# Patient Record
Sex: Female | Born: 1985
Health system: Southern US, Community
[De-identification: ages and names within clinical notes are randomized; demographics above are authoritative.]

## PROBLEM LIST (undated history)

## (undated) DIAGNOSIS — H409 Unspecified glaucoma: Secondary | ICD-10-CM

## (undated) DIAGNOSIS — I10 Essential (primary) hypertension: Secondary | ICD-10-CM

## (undated) DIAGNOSIS — E282 Polycystic ovarian syndrome: Secondary | ICD-10-CM

## (undated) DIAGNOSIS — Z975 Presence of (intrauterine) contraceptive device: Secondary | ICD-10-CM

## (undated) DIAGNOSIS — Z832 Family history of diseases of the blood and blood-forming organs and certain disorders involving the immune mechanism: Secondary | ICD-10-CM

## (undated) DIAGNOSIS — G4700 Insomnia, unspecified: Secondary | ICD-10-CM

## (undated) DIAGNOSIS — E669 Obesity, unspecified: Secondary | ICD-10-CM

## (undated) DIAGNOSIS — K649 Unspecified hemorrhoids: Secondary | ICD-10-CM

## (undated) DIAGNOSIS — N390 Urinary tract infection, site not specified: Secondary | ICD-10-CM

## (undated) DIAGNOSIS — Z973 Presence of spectacles and contact lenses: Secondary | ICD-10-CM

## (undated) HISTORY — DX: Urinary tract infection, site not specified: N39.0

## (undated) HISTORY — DX: Polycystic ovarian syndrome: E28.2

## (undated) HISTORY — DX: Obesity, unspecified: E66.9

## (undated) HISTORY — DX: Family history of diseases of the blood and blood-forming organs and certain disorders involving the immune mechanism: Z83.2

## (undated) HISTORY — DX: Insomnia, unspecified: G47.00

## (undated) HISTORY — DX: Presence of (intrauterine) contraceptive device: Z97.5

## (undated) HISTORY — DX: Unspecified hemorrhoids: K64.9

## (undated) HISTORY — DX: Presence of spectacles and contact lenses: Z97.3

## (undated) HISTORY — DX: Unspecified glaucoma: H40.9

## (undated) HISTORY — PX: WISDOM TOOTH EXTRACTION: SHX21

---
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
## 2003-02-21 ENCOUNTER — Emergency Department (HOSPITAL_COMMUNITY): Admission: EM | Admit: 2003-02-21 | Discharge: 2003-02-22 | Payer: Self-pay | Admitting: *Deleted

## 2007-01-14 DIAGNOSIS — E282 Polycystic ovarian syndrome: Secondary | ICD-10-CM

## 2007-01-14 HISTORY — PX: COLONOSCOPY: SHX174

## 2007-01-14 HISTORY — DX: Polycystic ovarian syndrome: E28.2

## 2008-08-01 ENCOUNTER — Encounter (INDEPENDENT_AMBULATORY_CARE_PROVIDER_SITE_OTHER): Payer: Self-pay | Admitting: Obstetrics and Gynecology

## 2008-08-01 ENCOUNTER — Inpatient Hospital Stay (HOSPITAL_COMMUNITY): Admission: AD | Admit: 2008-08-01 | Discharge: 2008-08-03 | Payer: Self-pay | Admitting: Obstetrics and Gynecology

## 2010-01-13 DIAGNOSIS — H409 Unspecified glaucoma: Secondary | ICD-10-CM

## 2010-01-13 HISTORY — DX: Unspecified glaucoma: H40.9

## 2010-04-21 LAB — CBC
HCT: 34.6 % — ABNORMAL LOW (ref 36.0–46.0)
HCT: 36.9 % (ref 36.0–46.0)
Hemoglobin: 11 g/dL — ABNORMAL LOW (ref 12.0–15.0)
Hemoglobin: 12 g/dL (ref 12.0–15.0)
MCHC: 31.9 g/dL (ref 30.0–36.0)
MCHC: 32.6 g/dL (ref 30.0–36.0)
MCV: 70.9 fL — ABNORMAL LOW (ref 78.0–100.0)
MCV: 72.9 fL — ABNORMAL LOW (ref 78.0–100.0)
Platelets: 232 10*3/uL (ref 150–400)
Platelets: 238 10*3/uL (ref 150–400)
RBC: 4.74 MIL/uL (ref 3.87–5.11)
RBC: 5.2 MIL/uL — ABNORMAL HIGH (ref 3.87–5.11)
RDW: 14.8 % (ref 11.5–15.5)
RDW: 15 % (ref 11.5–15.5)
WBC: 10.7 10*3/uL — ABNORMAL HIGH (ref 4.0–10.5)
WBC: 9.7 10*3/uL (ref 4.0–10.5)

## 2010-04-21 LAB — TYPE AND SCREEN
ABO/RH(D): B POS
Antibody Screen: POSITIVE
DAT, IgG: NEGATIVE
PT AG Type: NEGATIVE

## 2010-04-21 LAB — RPR: RPR Ser Ql: NONREACTIVE

## 2010-05-28 NOTE — Discharge Summary (Signed)
[name], [name]                ACCOUNT NO.:  [id number]   MEDICAL RECORD NO.:  [id number]          PATIENT TYPE:  INP   LOCATION:  9115                          FACILITY:  [facility]   PHYSICIAN:  Malachi Pro. Ambrose Mantle, M.D. DATE OF BIRTH:  18-Oct-1985   DATE OF ADMISSION:  08/01/2008  DATE OF DISCHARGE:  08/03/2008                               DISCHARGE SUMMARY   This is a 25 year old African American single female para 0, gravida 1,  EDC August 30, 2008, admitted with ruptured membranes and labor.  Blood  group and type was B+ with a positive antibody, anti-M.  Sickle cell was  positive.  Hemoglobin electrophoresis; 68.7% hemoglobin A, 27.7%  hemoglobin S, and 3.6% hemoglobin A2.  RPR nonreactive, rubella immune,  hepatitis B surface antigen negative, HIV negative, GC and chlamydia  negative, 1-hour Glucola 103, first trimester screen negative, AFP  negative, group B strep positive.  On January 24, 2008, the patient had  a vaginal ultrasound, crown-rump length 2.07 cm, 8 weeks 5 days, Anita [name]  August 30, 2008.  Followup ultrasound compatible with dates.  Prenatal  care was uncomplicated on July 31, 2008, when she began contracting and  then had spontaneous rupture of membranes.  She came to the Maternity  Admission Unit and her cervix was 4-5 cm dilated.   PAST MEDICAL HISTORY:   ALLERGIES:  No known allergies.   OPERATIONS:  None.   ILLNESSES:  Urinary tract infection.   SOCIAL HISTORY:  Alcohol, tobacco and drugs none.   FAMILY HISTORY:  Mother with high blood pressure and thyroid problem.  Father with DVTs and sickle cell trait.  Maternal grandmother CVA and  high blood pressure.  On admission, the patient's vital signs were  normal.  Heart and lungs were normal.  The abdomen is soft.  Fundal  height have been 35-1/2 cm on July 28, 2008, fetal heart tones were  reactive.  There was some mild variable decelerations.  Cervix was 6 cm  dilated by the delivery room nurse and 90% effaced,  vertex at a -2 at  3:20 a.m.  The patient was begun on IV penicillin because of her anti-M  antibody.  She was typed and screened.  She became fully dilated.  At  6:00 a.m., she was pushing well.  Contractions every 4 minutes of  Pitocin was begun.  She pushed well and delivered spontaneously LOA over  an intact perineum with a periclitoral laceration, Dr. Ambrose Mantle, a 5-pound  14-ounce female infant with Apgars of 9 at 1 and 9 at 5 minutes.  The  placenta was intact.  Uterus normal.  Clitoral laceration sutured with 3-  0 Vicryl.  Blood loss about 400 mL.  Postpartum, the patient and the  baby did well and was discharged on the second postpartum day.  The baby  was circumcised and was in the regular nursery.  RPR was nonreactive.  Blood group and type was B+ with a positive antibody, anti-M.  The  patient's initial hemoglobin was on 12.0, hematocrit 36.9, white count  97,100, platelet count 238,000.  Followup hemoglobin 11.0, hematocrit  34.6, white count 10,700, platelet count 232,000.   FINAL DIAGNOSES:  Intrauterine pregnancy at 35 weeks and 6 days,  delivered left occipitoanterior.   OPERATIONS:  Spontaneous delivery LOA, repair of periclitoral  laceration.   FINAL CONDITION:  Improved.   INSTRUCTIONS:  Our regular discharge instruction booklet.  Motrin 600 mg  30 tablets 1 every 6 hours as needed for pain is given at discharge.  The patient is asked to come back to the office in 6 weeks for followup  examination.      Malachi Pro. Ambrose Mantle, M.D.  Electronically Signed     TFH/MEDQ  D:  08/03/2008  T:  08/03/2008  Job:  [id number]

## 2011-08-28 ENCOUNTER — Ambulatory Visit (INDEPENDENT_AMBULATORY_CARE_PROVIDER_SITE_OTHER): Payer: BC Managed Care – PPO | Admitting: Physician Assistant

## 2011-08-28 VITALS — BP 118/82 | HR 62 | Temp 98.5°F | Resp 16 | Ht 66.5 in | Wt 193.0 lb

## 2011-08-28 DIAGNOSIS — R350 Frequency of micturition: Secondary | ICD-10-CM

## 2011-08-28 DIAGNOSIS — N390 Urinary tract infection, site not specified: Secondary | ICD-10-CM

## 2011-08-28 DIAGNOSIS — H409 Unspecified glaucoma: Secondary | ICD-10-CM | POA: Insufficient documentation

## 2011-08-28 LAB — POCT UA - MICROSCOPIC ONLY

## 2011-08-28 LAB — POCT URINALYSIS DIPSTICK
Bilirubin, UA: NEGATIVE
Glucose, UA: NEGATIVE
Spec Grav, UA: >=1.03

## 2011-08-28 MED ORDER — PHENAZOPYRIDINE HCL 200 MG PO TABS: 200.0000 mg | ORAL_TABLET | Freq: Three times a day (TID) | ORAL | Status: AC | PRN

## 2011-08-28 MED ORDER — NITROFURANTOIN MACROCRYSTAL 100 MG PO CAPS: 100.0000 mg | ORAL_CAPSULE | Freq: Four times a day (QID) | ORAL | Status: AC

## 2011-08-28 NOTE — Progress Notes (Signed)
  Subjective:    Patient ID: Anita [name], female    DOB: 06-22-1985, 26 y.o.   MRN: [id number]  HPI  Pt presents to clinic with 2 wk h/o urinary frequency and dysuria.  She has had some back pain and bladder pressure.  She has no fever, chills or nausea or vomiting.  She has no vaginal symptoms and no new sexual contacts.  She is not concerned about pregnancy.  Review of Systems  Constitutional: Negative for fever and chills.  Gastrointestinal: Positive for abdominal pain (pressure).  Genitourinary: Positive for dysuria, urgency and frequency. Negative for hematuria and vaginal discharge.  Musculoskeletal: Positive for back pain.       Objective:   Physical Exam  Vitals reviewed. Constitutional: She is oriented to person, place, and time. She appears well-developed and well-nourished.  HENT:  Head: Normocephalic and atraumatic.  Right Ear: External ear normal.  Left Ear: External ear normal.  Eyes: Conjunctivae are normal.  Cardiovascular: Normal rate, regular rhythm and normal heart sounds.   Pulmonary/Chest: Effort normal and breath sounds normal.  Abdominal: Soft. Bowel sounds are normal. There is tenderness (suprapubic ttp). There is no CVA tenderness.  Neurological: She is alert and oriented to person, place, and time.  Skin: Skin is warm and dry.  Psychiatric: She has a normal mood and affect. Her behavior is normal. Judgment and thought content normal.    Results for orders placed in visit on 08/28/11  POCT URINALYSIS DIPSTICK      Component Value Range   Color, UA yellow     Clarity, UA cloudy     Glucose, UA neg     Bilirubin, UA neg     Ketones, UA trace     Spec Grav, UA >=1.030     Blood, UA trace     pH, UA 5.5     Protein, UA trace     Urobilinogen, UA 0.2     Nitrite, UA neg     Leukocytes, UA moderate (2+)    POCT UA - MICROSCOPIC ONLY      Component Value Range   WBC, Ur, HPF, POC TNTC     RBC, urine, microscopic 2-5     Bacteria, U Microscopic  small     Mucus, UA small     Epithelial cells, urine per micros 2-4     Crystals, Ur, HPF, POC neg     Casts, Ur, LPF, POC neg     Yeast, UA neg           Assessment & Plan:   1. Urinary frequency  POCT urinalysis dipstick, POCT UA - Microscopic Only  2. UTI (lower urinary tract infection)  nitrofurantoin (MACRODANTIN) 100 MG capsule, phenazopyridine (PYRIDIUM) 200 MG tablet, Urine culture   Pt to push fluids.  Take abx. Answered questions.

## 2011-08-31 LAB — URINE CULTURE

## 2012-02-17 ENCOUNTER — Telehealth: Payer: Self-pay | Admitting: Internal Medicine

## 2012-02-17 NOTE — Telephone Encounter (Signed)
LVOM with Shawn for pt to return call.

## 2012-03-09 ENCOUNTER — Telehealth: Payer: Self-pay | Admitting: Oncology

## 2012-03-09 NOTE — Telephone Encounter (Signed)
Pt called to scheduled her NP appt 03/28 @ 10:30 w/Dr. Clelia Croft.  Pt requested Friday appt.  Dx- Persistent Low WBC Welcome packet mailed.

## 2012-03-11 ENCOUNTER — Telehealth: Payer: Self-pay | Admitting: Oncology

## 2012-03-11 NOTE — Telephone Encounter (Signed)
C/D 03/11/12 for appt. 04/09/12 °

## 2012-04-02 ENCOUNTER — Other Ambulatory Visit: Payer: Self-pay | Admitting: Oncology

## 2012-04-02 DIAGNOSIS — D72819 Decreased white blood cell count, unspecified: Secondary | ICD-10-CM

## 2012-04-09 ENCOUNTER — Encounter: Payer: Self-pay | Admitting: Oncology

## 2012-04-09 ENCOUNTER — Telehealth: Payer: Self-pay | Admitting: Oncology

## 2012-04-09 ENCOUNTER — Ambulatory Visit: Payer: Self-pay

## 2012-04-09 ENCOUNTER — Ambulatory Visit (HOSPITAL_BASED_OUTPATIENT_CLINIC_OR_DEPARTMENT_OTHER): Payer: BC Managed Care – PPO | Admitting: Oncology

## 2012-04-09 ENCOUNTER — Other Ambulatory Visit (HOSPITAL_BASED_OUTPATIENT_CLINIC_OR_DEPARTMENT_OTHER): Payer: BC Managed Care – PPO | Admitting: Lab

## 2012-04-09 VITALS — BP 123/67 | HR 77 | Temp 98.3°F | Resp 20 | Wt 217.9 lb

## 2012-04-09 DIAGNOSIS — N920 Excessive and frequent menstruation with regular cycle: Secondary | ICD-10-CM

## 2012-04-09 DIAGNOSIS — D72819 Decreased white blood cell count, unspecified: Secondary | ICD-10-CM

## 2012-04-09 DIAGNOSIS — R718 Other abnormality of red blood cells: Secondary | ICD-10-CM

## 2012-04-09 LAB — CBC WITH DIFFERENTIAL/PLATELET
Eosinophils Absolute: 0.2 10*3/uL (ref 0.0–0.5)
HCT: 36.7 % (ref 34.8–46.6)
LYMPH%: 25.5 % (ref 14.0–49.7)
MONO#: 0.4 10*3/uL (ref 0.1–0.9)
NEUT#: 2.5 10*3/uL (ref 1.5–6.5)
NEUT%: 59.9 % (ref 38.4–76.8)
Platelets: 236 10*3/uL (ref 145–400)
RBC: 5.16 10*6/uL (ref 3.70–5.45)
WBC: 4.2 10*3/uL (ref 3.9–10.3)
lymph#: 1.1 10*3/uL (ref 0.9–3.3)

## 2012-04-09 LAB — COMPREHENSIVE METABOLIC PANEL (CC13)
ALT: 9 U/L (ref 0–55)
Albumin: 3.5 g/dL (ref 3.5–5.0)
CO2: 28 mEq/L (ref 22–29)
Calcium: 9.2 mg/dL (ref 8.4–10.4)
Chloride: 105 mEq/L (ref 98–107)
Glucose: 73 mg/dl (ref 70–99)
Sodium: 141 mEq/L (ref 136–145)
Total Bilirubin: 0.21 mg/dL (ref 0.20–1.20)
Total Protein: 7.4 g/dL (ref 6.4–8.3)

## 2012-04-09 LAB — CHCC SMEAR

## 2012-04-09 NOTE — Progress Notes (Signed)
Checked in new pt with no financial concerns. °

## 2012-04-09 NOTE — Progress Notes (Signed)
Note dictated

## 2012-04-12 NOTE — Progress Notes (Signed)
CC:   Anita [name]  REASON FOR CONSULTATION:  Leukocytopenia.  HISTORY OF PRESENT ILLNESS:  This is a pleasant 27 year old [place] American woman, currently of [place].  She is a relatively healthy woman, without really any significant co-morbid conditions.  She has had a history of heavy menstrual bleeding in the past and has re-established care with [facility] providers.  She has been followed previously by Dr. Jackelyn Knife.  She had a CBC that was done in December 2013, and at that time her white cell count was 3.8, her hemoglobin was 11.8, an MCV of 64, and she was diagnosed with iron-deficiency anemia due to menorrhagia and was started on multivitamin.  A repeat blood count that was done on 02/13/2012 showed her hemoglobin was back to normal at 12, and her MCV was improved dramatically at 71.  Her total white cell count, however, was low at 1.9.  At that time her differential showed 45% lymphocytes, that was really within normal range.  Her neutrophils were at 43.9%, which is slightly low.  For that reason, the patient was referred to me for evaluation for leukocytopenia and neutropenia. Clinically she feels well.  She is asymptomatic.  She does not report any recurrent sinopulmonary infection, had not had any viral illnesses, had not had any hospitalizations, had not had anything to suggest autoimmune disorders.  She had not had any arthralgias or myalgias.  She has continued to work full time.  She does report mild fatigue by the end of the day, but not out of the ordinary.  She did not report any back pain.  Did not report any pathological fractures.  Performance status and activity level continue to be unchanged at this time.  REVIEW OF SYSTEMS:  No bleeding, headaches, blurry vision, double vision.  Does not report any motor or sensory neuropathy.  Does not report alteration in mental status.  Does not report any psychiatric issues, depression.  Does  not report any fever, chills, sweats.  Does not report any cough, hemoptysis, hematemesis.  No nausea, vomiting.  No abdominal pain, hematochezia, melena.  No genitourinary complaints. Rest of the review of systems was unremarkable.  PAST MEDICAL HISTORY:  Significant for history of glaucoma, but she denied any history of hypertension, diabetes, coronary disease.  She does have a history of menorrhagia.  MEDICATIONS:  She is currently on Travatan eye drops.  She also uses a NuvaRing for 3 weeks as directed.  ALLERGIES:  None.  SOCIAL HISTORY:  She is single.  She denies any alcohol or tobacco abuse.  She has 1 child.  She has been pregnant once.  FAMILY HISTORY:  Her father has high blood pressure, had history of a DVT.  Mother has diabetes and hypertension.  No history of blood disorders.  PHYSICAL EXAMINATION:  General:  Alert, awake, pleasant woman, appeared in no active distress.  Vital Signs:  Her blood pressure today is at 123/67, pulse 77, respirations 20, afebrile.  HEENT:  Head is normocephalic, atraumatic.  Pupils equal, round, and reactive to light. Oral mucosa moist and pink.  Neck:  Supple, without adenopathy.  Heart: Regular rate and rhythm.  S1, S2.  Lungs:  Clear to auscultation.  No rhonchi, wheeze, or dullness to percussion.  Abdomen:  Soft, nontender. No hepatosplenomegaly.  Extremities:  No clubbing, cyanosis, or edema. Neurologic:  Intact motor, sensory, and deep tendon reflexes.  LABORATORY DATA:  Showed a hemoglobin of 11.9, which was normal.  White cell count is 4.2, which  is normal.  Platelet count of 236,000, which is normal.  Her differential white cells are perfectly proportional of neutrophils, lymphocytes, and monocytes.  Her peripheral smear was personally reviewed today and did not really show any evidence of any dysplasia, did not show evidence of any schistocytosis or red cell fragmentation.  There was no evidence of any immature white cells or  red cells.  Her platelets appeared normal.  ASSESSMENT AND PLAN:  This is a pleasant 27 year old woman with the following issues. 1. Transient leukopenia and neutropenia.  Differential diagnosis     discussed today with Anita [name].  It is unclear to me why she     developed that one episode of neutropenia.  Her total white cell     count was normal in December 2013.  It is perfectly normal now with     a peripheral differential.  It is very possible that it could be     medication related or infection related or could be a sampling     error.  Either way, it seems to have resolved itself at this point.     I doubt there is any evidence of anything to suggest     myelodysplastic syndrome or a lymphoproliferative disorder or a     myeloproliferative disorder at this time; but for completeness     sake, I will repeat her blood counts in about 6 months, and if her     white cell count shows to be normal, then no further workup is     needed.  I do not see any need for any scanning or bone marrow     biopsy.  Most likely it was a reactive finding and has resolved at     this time. 2. A microcytosis without anemia.  Undoubtedly related to menorrhagia.     Continue to encourage her to take multivitamin, possibly oral iron     supplement as needed.  All her questions were answered today.    ______________________________ Benjiman Core, M.D. FNS/MEDQ  D:  04/09/2012  T:  04/09/2012  Job:  [id number]

## 2012-06-11 ENCOUNTER — Other Ambulatory Visit: Payer: Self-pay | Admitting: Lab

## 2012-06-11 ENCOUNTER — Ambulatory Visit: Payer: Self-pay | Admitting: Oncology

## 2012-06-11 ENCOUNTER — Ambulatory Visit: Payer: Self-pay

## 2012-09-02 ENCOUNTER — Telehealth: Payer: Self-pay | Admitting: Oncology

## 2012-09-02 NOTE — Telephone Encounter (Signed)
Pt called and moved 9/30 appt from AM to PM.

## 2012-10-11 ENCOUNTER — Telehealth: Payer: Self-pay | Admitting: Oncology

## 2012-10-11 NOTE — Telephone Encounter (Signed)
PT CALLED TO R/S APPTS TO TUESDAY...FIRST AVAILABLE.

## 2012-10-12 ENCOUNTER — Ambulatory Visit: Payer: Self-pay | Admitting: Oncology

## 2012-10-12 ENCOUNTER — Other Ambulatory Visit: Payer: Self-pay | Admitting: Lab

## 2012-11-02 ENCOUNTER — Ambulatory Visit: Payer: Self-pay | Admitting: Oncology

## 2012-11-02 ENCOUNTER — Other Ambulatory Visit: Payer: Self-pay | Admitting: Lab

## 2013-01-13 DIAGNOSIS — Z975 Presence of (intrauterine) contraceptive device: Secondary | ICD-10-CM

## 2013-01-13 HISTORY — DX: Presence of (intrauterine) contraceptive device: Z97.5

## 2013-02-17 ENCOUNTER — Ambulatory Visit: Payer: BC Managed Care – PPO | Admitting: Gynecology

## 2013-10-21 ENCOUNTER — Emergency Department (HOSPITAL_COMMUNITY)
Admission: EM | Admit: 2013-10-21 | Discharge: 2013-10-21 | Disposition: A | Discharge status: Home / Self Care | Payer: BC Managed Care – PPO | Attending: Emergency Medicine | Admitting: Emergency Medicine

## 2013-10-21 ENCOUNTER — Encounter (HOSPITAL_COMMUNITY): Payer: Self-pay | Admitting: Emergency Medicine

## 2013-10-21 ENCOUNTER — Emergency Department (HOSPITAL_COMMUNITY): Payer: BC Managed Care – PPO

## 2013-10-21 DIAGNOSIS — R109 Unspecified abdominal pain: Secondary | ICD-10-CM | POA: Diagnosis present

## 2013-10-21 DIAGNOSIS — R112 Nausea with vomiting, unspecified: Secondary | ICD-10-CM | POA: Insufficient documentation

## 2013-10-21 DIAGNOSIS — Z3202 Encounter for pregnancy test, result negative: Secondary | ICD-10-CM | POA: Insufficient documentation

## 2013-10-21 DIAGNOSIS — N3090 Cystitis, unspecified without hematuria: Secondary | ICD-10-CM | POA: Insufficient documentation

## 2013-10-21 DIAGNOSIS — K5909 Other constipation: Secondary | ICD-10-CM

## 2013-10-21 DIAGNOSIS — K5900 Constipation, unspecified: Secondary | ICD-10-CM | POA: Diagnosis not present

## 2013-10-21 DIAGNOSIS — N390 Urinary tract infection, site not specified: Secondary | ICD-10-CM

## 2013-10-21 LAB — CBC WITH DIFFERENTIAL/PLATELET
BASOS PCT: 0 % (ref 0–1)
Basophils Absolute: 0 10*3/uL (ref 0.0–0.1)
EOS PCT: 0 % (ref 0–5)
Eosinophils Absolute: 0 10*3/uL (ref 0.0–0.7)
HEMATOCRIT: 34.8 % — AB (ref 36.0–46.0)
Hemoglobin: 11.5 g/dL — ABNORMAL LOW (ref 12.0–15.0)
LYMPHS ABS: 1.1 10*3/uL (ref 0.7–4.0)
Lymphocytes Relative: 14 % (ref 12–46)
MCH: 22.9 pg — AB (ref 26.0–34.0)
MCHC: 33 g/dL (ref 30.0–36.0)
MCV: 69.3 fL — AB (ref 78.0–100.0)
MONO ABS: 0.3 10*3/uL (ref 0.1–1.0)
Monocytes Relative: 4 % (ref 3–12)
NEUTROS ABS: 6.4 10*3/uL (ref 1.7–7.7)
Neutrophils Relative %: 82 % — ABNORMAL HIGH (ref 43–77)
PLATELETS: 221 10*3/uL (ref 150–400)
RBC: 5.02 MIL/uL (ref 3.87–5.11)
RDW: 13.9 % (ref 11.5–15.5)
WBC: 7.8 10*3/uL (ref 4.0–10.5)

## 2013-10-21 LAB — COMPREHENSIVE METABOLIC PANEL
ALT: 6 U/L (ref 0–35)
AST: 11 U/L (ref 0–37)
Albumin: 3.9 g/dL (ref 3.5–5.2)
Alkaline Phosphatase: 82 U/L (ref 39–117)
Anion gap: 16 — ABNORMAL HIGH (ref 5–15)
BILIRUBIN TOTAL: 0.2 mg/dL — AB (ref 0.3–1.2)
BUN: 12 mg/dL (ref 6–23)
CALCIUM: 9.2 mg/dL (ref 8.4–10.5)
CO2: 22 meq/L (ref 19–32)
CREATININE: 0.83 mg/dL (ref 0.50–1.10)
Chloride: 102 mEq/L (ref 96–112)
GLUCOSE: 135 mg/dL — AB (ref 70–99)
Potassium: 3.6 mEq/L — ABNORMAL LOW (ref 3.7–5.3)
Sodium: 140 mEq/L (ref 137–147)
Total Protein: 8 g/dL (ref 6.0–8.3)

## 2013-10-21 LAB — URINALYSIS, ROUTINE W REFLEX MICROSCOPIC
Bilirubin Urine: NEGATIVE
Glucose, UA: NEGATIVE mg/dL
KETONES UR: NEGATIVE mg/dL
Nitrite: NEGATIVE
PROTEIN: NEGATIVE mg/dL
Specific Gravity, Urine: 1.028 (ref 1.005–1.030)
UROBILINOGEN UA: 1 mg/dL (ref 0.0–1.0)
pH: 7 (ref 5.0–8.0)

## 2013-10-21 LAB — URINE MICROSCOPIC-ADD ON

## 2013-10-21 LAB — POC URINE PREG, ED: Preg Test, Ur: NEGATIVE

## 2013-10-21 LAB — LIPASE, BLOOD: LIPASE: 16 U/L (ref 11–59)

## 2013-10-21 MED ORDER — CEPHALEXIN 500 MG PO CAPS: 500.0000 mg | ORAL_CAPSULE | Freq: Two times a day (BID) | ORAL | Status: DC

## 2013-10-21 MED ORDER — ONDANSETRON 4 MG PO TBDP: 4.0000 mg | ORAL_TABLET | Freq: Three times a day (TID) | ORAL | Status: DC | PRN

## 2013-10-21 MED ORDER — SODIUM CHLORIDE 0.9 % IV BOLUS (SEPSIS)
1000.0000 mL | Freq: Once | INTRAVENOUS | Status: AC
  Administered 2013-10-21: 1000 mL via INTRAVENOUS

## 2013-10-21 MED ORDER — DICYCLOMINE HCL 10 MG/ML IM SOLN
20.0000 mg | Freq: Once | INTRAMUSCULAR | Status: AC
  Administered 2013-10-21: 20 mg via INTRAMUSCULAR
  Filled 2013-10-21: qty 2

## 2013-10-21 MED ORDER — KETOROLAC TROMETHAMINE 30 MG/ML IJ SOLN
30.0000 mg | Freq: Once | INTRAMUSCULAR | Status: AC
Start: 2013-10-21 — End: 2013-10-21
  Administered 2013-10-21: 30 mg via INTRAVENOUS
  Filled 2013-10-21: qty 1

## 2013-10-21 MED ORDER — ONDANSETRON HCL 4 MG/2ML IJ SOLN
4.0000 mg | Freq: Once | INTRAMUSCULAR | Status: AC
  Administered 2013-10-21: 4 mg via INTRAVENOUS
  Filled 2013-10-21: qty 2

## 2013-10-21 NOTE — ED Notes (Signed)
Pt presents with c/o abdominal pain that started several hours ago. Pt reports the pain is mainly in the lower left side of her abdomen. Reports she has not had a bowel movement for approx one week. Reports vomiting three times since the pain started, no diarrhea.

## 2013-10-21 NOTE — Discharge Instructions (Signed)
Constipation °Constipation is when a person has fewer than three bowel movements a week, has difficulty having a bowel movement, or has stools that are dry, hard, or larger than normal. As people grow older, constipation is more common. If you try to fix constipation with medicines that make you have a bowel movement (laxatives), the problem may get worse. Long-term laxative use may cause the muscles of the colon to become weak. A low-fiber diet, not taking in enough fluids, and taking certain medicines may make constipation worse.  °CAUSES  °· Certain medicines, such as antidepressants, pain medicine, iron supplements, antacids, and water pills.   °· Certain diseases, such as diabetes, irritable bowel syndrome (IBS), thyroid disease, or depression.   °· Not drinking enough water.   °· Not eating enough fiber-rich foods.   °· Stress or travel.   °· Lack of physical activity or exercise.   °· Ignoring the urge to have a bowel movement.   °· Using laxatives too much.   °SIGNS AND SYMPTOMS  °· Having fewer than three bowel movements a week.   °· Straining to have a bowel movement.   °· Having stools that are hard, dry, or larger than normal.   °· Feeling full or bloated.   °· Pain in the lower abdomen.   °· Not feeling relief after having a bowel movement.   °DIAGNOSIS  °Your health care provider will take a medical history and perform a physical exam. Further testing may be done for severe constipation. Some tests may include: °· A barium enema X-ray to examine your rectum, colon, and, sometimes, your small intestine.   °· A sigmoidoscopy to examine your lower colon.   °· A colonoscopy to examine your entire colon. °TREATMENT  °Treatment will depend on the severity of your constipation and what is causing it. Some dietary treatments include drinking more fluids and eating more fiber-rich foods. Lifestyle treatments may include regular exercise. If these diet and lifestyle recommendations do not help, your health care  provider may recommend taking over-the-counter laxative medicines to help you have bowel movements. Prescription medicines may be prescribed if over-the-counter medicines do not work.  °HOME CARE INSTRUCTIONS  °· Eat foods that have a lot of fiber, such as fruits, vegetables, whole grains, and beans. °· Limit foods high in fat and processed sugars, such as french fries, hamburgers, cookies, candies, and soda.   °· A fiber supplement may be added to your diet if you cannot get enough fiber from foods.   °· Drink enough fluids to keep your urine clear or pale yellow.   °· Exercise regularly or as directed by your health care provider.   °· Go to the restroom when you have the urge to go. Do not hold it.   °· Only take over-the-counter or prescription medicines as directed by your health care provider. Do not take other medicines for constipation without talking to your health care provider first.   °SEEK IMMEDIATE MEDICAL CARE IF:  °· You have bright red blood in your stool.   °· Your constipation lasts for more than 4 days or gets worse.   °· You have abdominal or rectal pain.   °· You have thin, pencil-like stools.   °· You have unexplained weight loss. °MAKE SURE YOU:  °· Understand these instructions. °· Will watch your condition. °· Will get help right away if you are not doing well or get worse. °Document Released: 09/28/2003 Document Revised: 01/04/2013 Document Reviewed: 10/11/2012 °ExitCare® Patient Information ©2015 [employer]. This information is not intended to replace advice given to you by your health care provider. Make sure you discuss any questions   you have with your health care provider.  Nausea and Vomiting Nausea is a sick feeling that often comes before throwing up (vomiting). Vomiting is a reflex where stomach contents come out of your mouth. Vomiting can cause severe loss of body fluids (dehydration). Children and elderly adults can become dehydrated quickly, especially if they also have  diarrhea. Nausea and vomiting are symptoms of a condition or disease. It is important to find the cause of your symptoms. CAUSES   Direct irritation of the stomach lining. This irritation can result from increased acid production (gastroesophageal reflux disease), infection, food poisoning, taking certain medicines (such as nonsteroidal anti-inflammatory drugs), alcohol use, or tobacco use.  Signals from the brain.These signals could be caused by a headache, heat exposure, an inner ear disturbance, increased pressure in the brain from injury, infection, a tumor, or a concussion, pain, emotional stimulus, or metabolic problems.  An obstruction in the gastrointestinal tract (bowel obstruction).  Illnesses such as diabetes, hepatitis, gallbladder problems, appendicitis, kidney problems, cancer, sepsis, atypical symptoms of a heart attack, or eating disorders.  Medical treatments such as chemotherapy and radiation.  Receiving medicine that makes you sleep (general anesthetic) during surgery. DIAGNOSIS Your caregiver may ask for tests to be done if the problems do not improve after a few days. Tests may also be done if symptoms are severe or if the reason for the nausea and vomiting is not clear. Tests may include:  Urine tests.  Blood tests.  Stool tests.  Cultures (to look for evidence of infection).  X-rays or other imaging studies. Test results can help your caregiver make decisions about treatment or the need for additional tests. TREATMENT You need to stay well hydrated. Drink frequently but in small amounts.You may wish to drink water, sports drinks, clear broth, or eat frozen ice pops or gelatin dessert to help stay hydrated.When you eat, eating slowly may help prevent nausea.There are also some antinausea medicines that may help prevent nausea. HOME CARE INSTRUCTIONS   Take all medicine as directed by your caregiver.  If you do not have an appetite, do not force yourself to  eat. However, you must continue to drink fluids.  If you have an appetite, eat a normal diet unless your caregiver tells you differently.  Eat a variety of complex carbohydrates (rice, wheat, potatoes, bread), lean meats, yogurt, fruits, and vegetables.  Avoid high-fat foods because they are more difficult to digest.  Drink enough water and fluids to keep your urine clear or pale yellow.  If you are dehydrated, ask your caregiver for specific rehydration instructions. Signs of dehydration may include:  Severe thirst.  Dry lips and mouth.  Dizziness.  Dark urine.  Decreasing urine frequency and amount.  Confusion.  Rapid breathing or pulse. SEEK IMMEDIATE MEDICAL CARE IF:   You have blood or brown flecks (like coffee grounds) in your vomit.  You have black or bloody stools.  You have a severe headache or stiff neck.  You are confused.  You have severe abdominal pain.  You have chest pain or trouble breathing.  You do not urinate at least once every 8 hours.  You develop cold or clammy skin.  You continue to vomit for longer than 24 to 48 hours.  You have a fever. MAKE SURE YOU:   Understand these instructions.  Will watch your condition.  Will get help right away if you are not doing well or get worse. Document Released: 12/30/2004 Document Revised: 03/24/2011 Document Reviewed: 05/29/2010 ExitCare Patient  Information 2015 [place], [place]. This information is not intended to replace advice given to you by your health care provider. Make sure you discuss any questions you have with your health care provider.  Urinary Tract Infection Urinary tract infections (UTIs) can develop anywhere along your urinary tract. Your urinary tract is your body's drainage system for removing wastes and extra water. Your urinary tract includes two kidneys, two ureters, a bladder, and a urethra. Your kidneys are a pair of bean-shaped organs. Each kidney is about the size of your  fist. They are located below your ribs, one on each side of your spine. CAUSES Infections are caused by microbes, which are microscopic organisms, including fungi, viruses, and bacteria. These organisms are so small that they can only be seen through a microscope. Bacteria are the microbes that most commonly cause UTIs. SYMPTOMS  Symptoms of UTIs may vary by age and gender of the patient and by the location of the infection. Symptoms in young women typically include a frequent and intense urge to urinate and a painful, burning feeling in the bladder or urethra during urination. Older women and men are more likely to be tired, shaky, and weak and have muscle aches and abdominal pain. A fever may mean the infection is in your kidneys. Other symptoms of a kidney infection include pain in your back or sides below the ribs, nausea, and vomiting. DIAGNOSIS To diagnose a UTI, your caregiver will ask you about your symptoms. Your caregiver also will ask to provide a urine sample. The urine sample will be tested for bacteria and white blood cells. White blood cells are made by your body to help fight infection. TREATMENT  Typically, UTIs can be treated with medication. Because most UTIs are caused by a bacterial infection, they usually can be treated with the use of antibiotics. The choice of antibiotic and length of treatment depend on your symptoms and the type of bacteria causing your infection. HOME CARE INSTRUCTIONS  If you were prescribed antibiotics, take them exactly as your caregiver instructs you. Finish the medication even if you feel better after you have only taken some of the medication.  Drink enough water and fluids to keep your urine clear or pale yellow.  Avoid caffeine, tea, and carbonated beverages. They tend to irritate your bladder.  Empty your bladder often. Avoid holding urine for long periods of time.  Empty your bladder before and after sexual intercourse.  After a bowel  movement, women should cleanse from front to back. Use each tissue only once. SEEK MEDICAL CARE IF:   You have back pain.  You develop a fever.  Your symptoms do not begin to resolve within 3 days. SEEK IMMEDIATE MEDICAL CARE IF:   You have severe back pain or lower abdominal pain.  You develop chills.  You have nausea or vomiting.  You have continued burning or discomfort with urination. MAKE SURE YOU:   Understand these instructions.  Will watch your condition.  Will get help right away if you are not doing well or get worse. Document Released: 10/09/2004 Document Revised: 07/01/2011 Document Reviewed: 02/07/2011 [facility] LLCExit[facility] Patient Information 2015 [place], [place]. This information is not intended to replace advice given to you by your health care provider. Make sure you discuss any questions you have with your health care provider.

## 2013-10-21 NOTE — ED Provider Notes (Signed)
TIME SEEN: 4:14 AM  CHIEF COMPLAINT: Abdominal pain, vomiting  HPI: Patient is a 28 year old female with history of chronic constipation who presents emergency department with sharp left lower corner pain that started last night. She has had 5 episodes of nonbloody, nonbilious vomiting. She reports her last bowel movement was over one week ago until she had a bowel movement tonight. She states it is not abnormal for her to go for several days without a bowel movement. She has been passing gas. Denies any fevers, chills, diarrhea, bloody stool or melena, dysuria or hematuria, vaginal bleeding or discharge. No sick contacts or recent travel. No history of abdominal surgery. She is on Implanon for birth control and has not had a menstrual cycle in several months.  ROS: See HPI Constitutional: no fever  Eyes: no drainage  ENT: no runny nose   Cardiovascular:  no chest pain  Resp: no SOB  GI:  vomiting GU: no dysuria Integumentary: no rash  Allergy: no hives  Musculoskeletal: no leg swelling  Neurological: no slurred speech ROS otherwise negative  PAST MEDICAL HISTORY/PAST SURGICAL HISTORY:  History reviewed. No pertinent past medical history.  MEDICATIONS:  Prior to Admission medications   Medication Sig Start Date End Date Taking? Authorizing Provider  Travoprost, BAK Free, (TRAVATAN) 0.004 % SOLN ophthalmic solution 1 drop at bedtime.    Historical Provider, MD    ALLERGIES:  No Known Allergies  SOCIAL HISTORY:  History  Substance Use Topics  . Smoking status: Never Smoker   . Smokeless tobacco: Not on file  . Alcohol Use: No    FAMILY HISTORY: No family history on file.  EXAM: BP 135/75  Pulse 66  Temp(Src) 98 F (36.7 C) (Oral)  Resp 18  SpO2 100% CONSTITUTIONAL: Alert and oriented and responds appropriately to questions. Well-appearing; well-nourished, pleasant, nontoxic, well-hydrated HEAD: Normocephalic EYES: Conjunctivae clear, PERRL ENT: normal nose; no  rhinorrhea; moist mucous membranes; pharynx without lesions noted NECK: Supple, no meningismus, no LAD  CARD: RRR; S1 and S2 appreciated; no murmurs, no clicks, no rubs, no gallops RESP: Normal chest excursion without splinting or tachypnea; breath sounds clear and equal bilaterally; no wheezes, no rhonchi, no rales,  ABD/GI: Normal bowel sounds; non-distended; soft, mildly tender to palpation diffusely over the left abdomen guarding or rebound, no peritoneal signs, no tenderness at McBurney's point, negative Murphy sign BACK:  The back appears normal and is non-tender to palpation, there is no CVA tenderness EXT: Normal ROM in all joints; non-tender to palpation; no edema; normal capillary refill; no cyanosis    SKIN: Normal color for age and race; warm NEURO: Moves all extremities equally PSYCH: The patient's mood and manner are appropriate. Grooming and personal hygiene are appropriate.  MEDICAL DECISION MAKING: Patient here with left-sided abdominal pain, vomiting and constipation which he reports is chronic. Her abdominal exam is relatively benign. Suspect pain from peristalsis. Low suspicion for bowel obstruction though will obtain acute abdominal series. I am not concerned for cholecystitis, appendicitis, colitis. Differential diagnosis also includes UTI, pancreatitis. Will obtain abdominal labs, urine. Will give IV fluids, Zofran, Toradol and Bentyl.  ED PROGRESS: Patient's labs are unremarkable. No leukocytosis. Pregnancy test is negative. Urinalysis does show small hemoglobin, leukocytes and bacteria. Culture pending. Will treat UTI with Keflex.  Acute abdominal series shows an unremarkable bowel gas pattern with no free air.   Patient reports feeling much better. She has no pain after Toradol and Bentyl. No further vomiting and has tolerated by mouth. We'll discharge  with prescription for Keflex for her UTI. Will discharge with Zofran for nausea. Discussed with her the importance of using  MiraLAX, Colace over-the-counter and increasing her water and fiber intake for her chronic constipation. Discussed return precautions. She verbalized understanding and is comfortable with plan.    Layla MawKristen N Yanuel Tagg, DO 10/21/13 [phone]

## 2013-10-22 LAB — URINE CULTURE

## 2014-04-14 DIAGNOSIS — G4700 Insomnia, unspecified: Secondary | ICD-10-CM

## 2014-04-14 HISTORY — DX: Insomnia, unspecified: G47.00

## 2014-05-01 ENCOUNTER — Ambulatory Visit (INDEPENDENT_AMBULATORY_CARE_PROVIDER_SITE_OTHER): Payer: BLUE CROSS/BLUE SHIELD | Admitting: Medical

## 2014-05-01 ENCOUNTER — Encounter: Payer: Self-pay | Admitting: Medical

## 2014-05-01 VITALS — BP 112/80 | HR 80 | Temp 98.5°F | Resp 15 | Wt 256.0 lb

## 2014-05-01 DIAGNOSIS — F4321 Adjustment disorder with depressed mood: Secondary | ICD-10-CM | POA: Diagnosis not present

## 2014-05-01 DIAGNOSIS — G4700 Insomnia, unspecified: Secondary | ICD-10-CM

## 2014-05-01 MED ORDER — ALPRAZOLAM 0.25 MG PO TABS: 0.2500 mg | ORAL_TABLET | Freq: Every evening | ORAL | Status: DC | PRN

## 2014-05-01 NOTE — Progress Notes (Signed)
Subjective: Here for new patient to establish for concerns.  Does see [facility] for routine gyn care.  Hasn't really had primary care provider in the past.  Here for sleep issues.  She notes that her long term boyfriend of almost 12 years passed away in January 2016, thinks it was drug overdose.  He died in the house they were all living in with her baby there.   Was not having any sleep issues prior to this.   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  Sometimes can get to sleep initially, sometimes not.  On the weekends doesn't sleep well at all.   Sometimes awakes 1 or 2 am, and often can't get back to sleep.  Sometimes goes back to sleep and sleeps hard from 2-4am.    Was used to him sleeping in the same bed with her.  Hard to adjust.   Has 31yo that relies on her, can't be knocked out but needs something to help.   Lives at home with her 31yo, and her mother lives there.  Has tried benadryl, dollar store sleep aid.  benadryl helps some days.  By Thursday in the week is just fatigued due to lack of good sleep.  Started counseling few weeks ago, has been to 3 sessions.   Her 31yo started counseling too given the loss of his father.   No prior prescription sleep aid.   Drives for [employer].  No other aggravating or relieving factors.  No other complaint.   Objective: BP 112/80 mmHg  Pulse 80  Temp(Src) 98.5 F (36.9 C) (Oral)  Resp 15  Wt 256 lb (116.121 kg)  Gen: wd, wn, obese AA female Psych: pleasant, good eye contact, answers questions approprietly    Assessment: Encounter Diagnoses  Name Primary?  . Insomnia Yes  . Grieving     Plan: Discussed sleep hygiene for both her and son, c/t counseling for her and son, begin trial of Xanax for sleep aid.  F/u 2wk. answered her questions and she will call if any issues in the meantime.

## 2014-05-15 ENCOUNTER — Ambulatory Visit (INDEPENDENT_AMBULATORY_CARE_PROVIDER_SITE_OTHER): Payer: BLUE CROSS/BLUE SHIELD | Admitting: Medical

## 2014-05-15 ENCOUNTER — Encounter: Payer: Self-pay | Admitting: Medical

## 2014-05-15 VITALS — BP 100/70 | HR 69 | Temp 98.2°F | Resp 16 | Ht 67.0 in | Wt 257.0 lb

## 2014-05-15 DIAGNOSIS — Z0000 Encounter for general adult medical examination without abnormal findings: Secondary | ICD-10-CM

## 2014-05-15 DIAGNOSIS — F4321 Adjustment disorder with depressed mood: Secondary | ICD-10-CM | POA: Diagnosis not present

## 2014-05-15 DIAGNOSIS — E669 Obesity, unspecified: Secondary | ICD-10-CM

## 2014-05-15 DIAGNOSIS — G4700 Insomnia, unspecified: Secondary | ICD-10-CM | POA: Diagnosis not present

## 2014-05-15 DIAGNOSIS — H409 Unspecified glaucoma: Secondary | ICD-10-CM

## 2014-05-15 DIAGNOSIS — Z833 Family history of diabetes mellitus: Secondary | ICD-10-CM

## 2014-05-15 DIAGNOSIS — Z113 Encounter for screening for infections with a predominantly sexual mode of transmission: Secondary | ICD-10-CM

## 2014-05-15 DIAGNOSIS — E282 Polycystic ovarian syndrome: Secondary | ICD-10-CM | POA: Diagnosis not present

## 2014-05-15 LAB — COMPREHENSIVE METABOLIC PANEL
ALBUMIN: 3.9 g/dL (ref 3.5–5.2)
AST: 13 U/L (ref 0–37)
Alkaline Phosphatase: 88 U/L (ref 39–117)
BUN: 16 mg/dL (ref 6–23)
CHLORIDE: 102 meq/L (ref 96–112)
CO2: 27 meq/L (ref 19–32)
Calcium: 9.4 mg/dL (ref 8.4–10.5)
Creat: 0.73 mg/dL (ref 0.50–1.10)
Glucose, Bld: 85 mg/dL (ref 70–99)
Potassium: 4.5 mEq/L (ref 3.5–5.3)
SODIUM: 139 meq/L (ref 135–145)
Total Bilirubin: 0.4 mg/dL (ref 0.2–1.2)
Total Protein: 7.6 g/dL (ref 6.0–8.3)

## 2014-05-15 LAB — POCT URINALYSIS DIPSTICK
Bilirubin, UA: NEGATIVE
Glucose, UA: NEGATIVE
KETONES UA: NEGATIVE
Leukocytes, UA: NEGATIVE
Nitrite, UA: NEGATIVE
PROTEIN UA: NEGATIVE
RBC UA: NEGATIVE
Spec Grav, UA: >=1.03
UROBILINOGEN UA: NEGATIVE
pH, UA: 6

## 2014-05-15 LAB — CBC
HCT: 39.3 % (ref 36.0–46.0)
Hemoglobin: 12.5 g/dL (ref 12.0–15.0)
MCH: 22.2 pg — ABNORMAL LOW (ref 26.0–34.0)
MCHC: 31.8 g/dL (ref 30.0–36.0)
MCV: 69.8 fL — ABNORMAL LOW (ref 78.0–100.0)
MPV: 9.6 fL (ref 8.6–12.4)
Platelets: 288 10*3/uL (ref 150–400)
RBC: 5.63 MIL/uL — ABNORMAL HIGH (ref 3.87–5.11)
RDW: 15.6 % — AB (ref 11.5–15.5)
WBC: 4.9 10*3/uL (ref 4.0–10.5)

## 2014-05-15 LAB — LIPID PANEL
Cholesterol: 173 mg/dL (ref 0–200)
HDL: 54 mg/dL (ref >=46)
LDL CALC: 109 mg/dL — AB (ref 0–99)
TRIGLYCERIDES: 52 mg/dL (ref <150)
Total CHOL/HDL Ratio: 3.2 Ratio
VLDL: 10 mg/dL (ref 0–40)

## 2014-05-15 LAB — HEMOGLOBIN A1C
Hgb A1c MFr Bld: 5.6 % (ref <5.7)
Mean Plasma Glucose: 114 mg/dL (ref <117)

## 2014-05-15 LAB — TSH: TSH: 0.478 u[IU]/mL (ref 0.350–4.500)

## 2014-05-15 NOTE — Progress Notes (Signed)
Subjective:   HPI  Anita [name] is a 29 y.o. female who presents for a complete physical.  Preventative care: Last [facility] Last dental visit:n/a Last colonoscopy: 2009 Last mammogram:n/a Last gynecological exam:[facility] Last EKG: no prior  Last labs: 1 year ago+  Prior vaccinations: TD or Tdap:2015 Influenza:n/a Pneumococcal:n/a  Concerns: Obesity - wants help losing weight.  Reviewed their medical, surgical, family, social, medication, and allergy history and updated chart as appropriate.  Past Medical History  Diagnosis Date  . Glaucoma 2012    Dr. Velna Ochsraig [place], [facility]  . Wears glasses   . Urinary tract infection   . PCOS (polycystic ovarian syndrome) 2009  . Nexplanon in place 2015    Dr. Lynden AngElizabeth Lane, [facility]; irregular periods prior to Nexplanon  . Insomnia 04/2014    associated with loss of significant other  . Obesity   . Family history of clotting disorder     father  . Hemorrhoids     Past Surgical History  Procedure Laterality Date  . Colonoscopy  2009    normal, due to irregular stool.  [facility]    History   Social History  . Marital Status: Single    Spouse Name: N/A  . Number of Children: N/A  . Years of Education: N/A   Occupational History  . Not on file.   Social History Main Topics  . Smoking status: Never Smoker   . Smokeless tobacco: Not on file  . Alcohol Use: Yes     Comment: occasional  . Drug Use: No  . Sexual Activity: Not on file   Other Topics Concern  . Not on file   Social History Narrative   Lives with mother and son, [name], exercise - none as of 05/2014.  Plans to start walking.  Wants to lose 40lb as of 05/2014.  Drives for [employer].    Family History  Problem Relation Age of Onset  . Diabetes Mother   . Hypertension Mother   . Hyperlipidemia Mother   . Hyperlipidemia Father   . Heart disease Father 40    mural thrombis  . Deep vein  thrombosis Father   . Dementia Maternal Grandmother   . Hypertension Maternal Grandmother   . Stroke Maternal Grandmother   . COPD Maternal Grandfather     emphysema  . Hypertension Paternal Grandmother      Current outpatient prescriptions:  .  ALPRAZolam (XANAX) 0.25 MG tablet, Take 1 tablet (0.25 mg total) by mouth at bedtime as needed for anxiety., Disp: 20 tablet, Rfl: 0 .  Travoprost, BAK Free, (TRAVATAN) 0.004 % SOLN ophthalmic solution, Place 1 drop into both eyes at bedtime. , Disp: , Rfl:   No Known Allergies    Review of Systems Constitutional: -fever, -chills, -sweats, -unexpected weight change, -decreased appetite, -fatigue Allergy: -sneezing, -itching, -congestion Dermatology: -changing moles, --rash, -lumps ENT: -runny nose, -ear pain, -sore throat, -hoarseness, -sinus pain, -teeth pain, - ringing in ears, -hearing loss, -nosebleeds Cardiology: -chest pain, -palpitations, -swelling, -difficulty breathing when lying flat, -waking up short of breath Respiratory: -cough, -shortness of breath, -difficulty breathing with exercise or exertion, -wheezing, -coughing up blood Gastroenterology: -abdominal pain, -nausea, -vomiting, -diarrhea, -constipation, -blood in stool, -changes in bowel movement, -difficulty swallowing or eating Hematology: -bleeding, -bruising  Musculoskeletal: -joint aches, -muscle aches, -joint swelling, -back pain, -neck pain, -cramping, -changes in gait Ophthalmology: denies vision changes, eye redness, itching, discharge Urology: -burning with urination, -difficulty urinating, -blood  in urine, -urinary frequency, -urgency, -incontinence Neurology: -headache, -weakness, -tingling, -numbness, -memory loss, -falls, -dizziness Psychology: -depressed mood, -agitation, -sleep problems     Objective:   Physical Exam  BP 100/70 mmHg  Pulse 69  Temp(Src) 98.2 F (36.8 C) (Oral)  Resp 16  Ht  (1.702 m)  Wt 257 lb (116.574 kg)  BMI 40.24  kg/m2  General appearance: alert, no distress, WD/WN, obese AA female Skin: few scattered benign macules on torso, striae of abdomen, upper arms HEENT: normocephalic, conjunctiva/corneas normal, sclerae anicteric, PERRLA, EOMi, nares patent, no discharge or erythema, pharynx normal Oral cavity: MMM, tongue normal, teeth normal Neck: supple, no lymphadenopathy, no thyromegaly, no masses, normal ROM, no bruits Chest: non tender, normal shape and expansion Heart: RRR, normal S1, S2, no murmurs Lungs: CTA bilaterally, no wheezes, rhonchi, or rales Abdomen: +bs, soft, non tender, non distended, no masses, no hepatomegaly, no splenomegaly, no bruits Back: non tender, normal ROM, no scoliosis Musculoskeletal: upper extremities non tender, no obvious deformity, normal ROM throughout, lower extremities non tender, no obvious deformity, normal ROM throughout Extremities: no edema, no cyanosis, no clubbing Pulses: 2+ symmetric, upper and lower extremities, normal cap refill Neurological: alert, oriented x 3, CN2-12 intact, strength normal upper extremities and lower extremities, sensation normal throughout, DTRs 2+ throughout, no cerebellar signs, gait normal Psychiatric: normal affect, behavior normal, pleasant  Breast/gyn/rectal - deferred to gyn   Assessment and Plan :    Encounter Diagnoses  Name Primary?  . Encounter for health maintenance examination in adult Yes  . Obesity   . Family history of diabetes mellitus (DM)   . Glaucoma   . PCOS (polycystic ovarian syndrome)   . Insomnia   . Grieving   . Screen for STD (sexually transmitted disease)     Physical exam - discussed healthy lifestyle, diet, exercise, preventative care, vaccinations, and addressed their concerns.  Handout given. See eye doctor, dentist and gynecologist yearly Discussed obesity, weight loss efforts, exercise, goal setting, diet, and possible medications She will check with father about his hx/o blood  clots glaucoma - on medication PCOS noted Insomnia - doing fine on Xanax prn, improving Grieving - doing better than last visit STD screening today Vaccines - advised yearly flu vaccine Contraception - on nexplanon placed 2015 Follow-up pending labs

## 2014-05-16 ENCOUNTER — Other Ambulatory Visit: Payer: Self-pay | Admitting: Medical

## 2014-05-16 LAB — GC/CHLAMYDIA PROBE AMP
CT PROBE, AMP APTIMA: NEGATIVE
GC PROBE AMP APTIMA: NEGATIVE

## 2014-05-16 LAB — HIV ANTIBODY (ROUTINE TESTING W REFLEX): HIV: NONREACTIVE

## 2014-05-16 LAB — RPR

## 2014-05-16 MED ORDER — PHENTERMINE-TOPIRAMATE ER 7.5-46 MG PO CP24: 1.0000 | ORAL_CAPSULE | ORAL | Status: DC

## 2014-05-16 MED ORDER — PHENTERMINE-TOPIRAMATE ER 3.75-23 MG PO CP24: 1.0000 | ORAL_CAPSULE | ORAL | Status: DC

## 2014-05-17 ENCOUNTER — Other Ambulatory Visit: Payer: Self-pay | Admitting: Family Medicine

## 2014-05-17 MED ORDER — PHENTERMINE-TOPIRAMATE ER 3.75-23 MG PO CP24: 1.0000 | ORAL_CAPSULE | ORAL | Status: DC

## 2014-05-17 MED ORDER — PHENTERMINE-TOPIRAMATE ER 7.5-46 MG PO CP24: 1.0000 | ORAL_CAPSULE | ORAL | Status: DC

## 2015-04-05 ENCOUNTER — Encounter: Payer: Self-pay | Admitting: Medical

## 2015-04-05 ENCOUNTER — Ambulatory Visit (INDEPENDENT_AMBULATORY_CARE_PROVIDER_SITE_OTHER): Payer: BLUE CROSS/BLUE SHIELD | Admitting: Medical

## 2015-04-05 VITALS — BP 120/88 | HR 75 | Temp 98.2°F | Resp 18 | Wt 250.0 lb

## 2015-04-05 DIAGNOSIS — J988 Other specified respiratory disorders: Secondary | ICD-10-CM

## 2015-04-05 DIAGNOSIS — J0100 Acute maxillary sinusitis, unspecified: Secondary | ICD-10-CM | POA: Diagnosis not present

## 2015-04-05 MED ORDER — HYDROCODONE-HOMATROPINE 5-1.5 MG/5ML PO SYRP: 5.0000 mL | ORAL_SOLUTION | Freq: Three times a day (TID) | ORAL | Status: DC | PRN

## 2015-04-05 MED ORDER — AMOXICILLIN-POT CLAVULANATE 875-125 MG PO TABS: 1.0000 | ORAL_TABLET | Freq: Two times a day (BID) | ORAL | Status: DC

## 2015-04-05 NOTE — Progress Notes (Signed)
Subjective: Chief Complaint  Patient presents with  . Cough    green mucous. sometimes her coughing causes pain in her chest. taken theraflu, alkaseltzer, and mucinex dm. said this happened last year as well said no fever.      Anita [name] is a 30 y.o. female who presents for possible bronchitis.  Has had 1+ week of cough, green nasal drainage, green sputum, rough cough, some body aches.  No sinus pain, but some sore throat, some ear discomfort.   Sometimes nausea.   No vomiting, no diarrhea.  Nonsmoker.  No particular sick contacts.    No other aggravating or relieving factors.  No other c/o.  The following portions of the patient's history were reviewed and updated as appropriate: allergies, current medications, past family history, past medical history, past social history, past surgical history and problem list.  ROS as in subjective  Past Medical History  Diagnosis Date  . Glaucoma 2012    Dr. Velna Ochs, [facility]  . Wears glasses   . Urinary tract infection   . PCOS (polycystic ovarian syndrome) 2009  . Nexplanon in place 2015    Dr. Lynden Ang, [facility]; irregular periods prior to Nexplanon  . Insomnia 04/2014    associated with loss of significant other  . Obesity   . Family history of clotting disorder     father  . Hemorrhoids      Objective: BP 120/88 mmHg  Pulse 75  Temp(Src) 98.2 F (36.8 C) (Tympanic)  Resp 18  Wt 250 lb (113.399 kg)  SpO2 98%  General appearance: Alert, WD/WN, no distress,somewhat  ill appearing                             Skin: warm, no rash, no diaphoresis                           Head: + sinus tenderness                            Eyes: conjunctiva normal, corneas clear, PERRLA                            Ears: pearly TMs, external ear canals normal                          Nose: septum midline, turbinates swollen, with erythema and thick mucoid discharge             Mouth/throat: MMM, tongue normal, mild  pharyngeal erythema                           Neck: supple, no adenopathy, no thyromegaly, nontender                          Heart: RRR, normal S1, S2, no murmurs                         Lungs: +bronchial breath sounds, -rhonchi, no wheezes, no rales                Extremities: no edema, nontender     Assessment: Encounter Diagnoses  Name Primary?  Marland Kitchen Respiratory tract  infection Yes  . Acute maxillary sinusitis, recurrence not specified      Plan:  Medication orders today include: Augmentin, Hycodan syrup QHS use, can use Mucinex Dm in the day.   Discussed diagnosis and treatment, Tylenol or Ibuprofen OTC for fever and malaise., hydrate well, rest.   Call/return in 2-3 days if symptoms are worse or not improving.  Advised that cough may linger even after the infection is improved.     Anita [name] was seen today for cough.  Diagnoses and all orders for this visit:  Respiratory tract infection  Acute maxillary sinusitis, recurrence not specified  Other orders -     HYDROcodone-homatropine (HYCODAN) 5-1.5 MG/5ML syrup; Take 5 mLs by mouth every 8 (eight) hours as needed for cough. -     amoxicillin-clavulanate (AUGMENTIN) 875-125 MG tablet; Take 1 tablet by mouth 2 (two) times daily.

## 2015-05-07 ENCOUNTER — Ambulatory Visit: Payer: BLUE CROSS/BLUE SHIELD | Admitting: Medical

## 2015-06-28 ENCOUNTER — Encounter: Payer: Self-pay | Admitting: Family Medicine

## 2015-06-28 ENCOUNTER — Ambulatory Visit (INDEPENDENT_AMBULATORY_CARE_PROVIDER_SITE_OTHER): Payer: BLUE CROSS/BLUE SHIELD | Admitting: Family Medicine

## 2015-06-28 VITALS — BP 118/70 | HR 80 | Temp 98.4°F | Ht 67.0 in | Wt 253.0 lb

## 2015-06-28 DIAGNOSIS — J069 Acute upper respiratory infection, unspecified: Secondary | ICD-10-CM | POA: Diagnosis not present

## 2015-06-28 MED ORDER — AZITHROMYCIN 250 MG PO TABS: ORAL_TABLET | ORAL | Status: DC

## 2015-06-28 NOTE — Progress Notes (Signed)
Chief Complaint  Patient presents with  . Nasal Congestion    reports green mucus when blowing nose, cough and some hoarseness.    A week or so ago she started with sneezing, and some sore throat.  Mucus was clear at first, and changed to green 2-3 days ago.  She denies headaches or sinus pressure. She has a mild cough, triggered by postnasal drip. Cough is mostly in the morning. Sometimes she coughs up phlegm during the day, slight green.  Denies shortness of breath, or trouble sleeping related to the cough. She has tried Lobbyist, Investment banker, operational, vaporizer.  Hasn't tried cough syrups.    She had a similar problems 3 months ago, with green mucus, and was seen here. Diagnosed with sinusitis and treated with augmentin and hycodan syrup.  PMH, PSH, SH reviewed  Outpatient Encounter Prescriptions as of 06/28/2015  Medication Sig  . ALPRAZolam (XANAX) 0.25 MG tablet Take 1 tablet (0.25 mg total) by mouth at bedtime as needed for anxiety.  . Travoprost, BAK Free, (TRAVATAN) 0.004 % SOLN ophthalmic solution Place 1 drop into both eyes at bedtime.   . [DISCONTINUED] amoxicillin-clavulanate (AUGMENTIN) 875-125 MG tablet Take 1 tablet by mouth 2 (two) times daily. (Patient not taking: Reported on 06/28/2015)  . [DISCONTINUED] HYDROcodone-homatropine (HYCODAN) 5-1.5 MG/5ML syrup Take 5 mLs by mouth every 8 (eight) hours as needed for cough.  . [DISCONTINUED] Phentermine-Topiramate (QSYMIA) 3.75-23 MG CP24 Take 1 capsule by mouth every morning. (Patient not taking: Reported on 04/05/2015)  . [DISCONTINUED] Phentermine-Topiramate (QSYMIA) 7.5-46 MG CP24 Take 1 capsule by mouth every morning. (Patient not taking: Reported on 04/05/2015)   No facility-administered encounter medications on file as of 06/28/2015.   No Known Allergies  ROS: no fever, chills, dizziness, chest pain, palpitations GI or GU complaints, bleeding, bruising, rash. See HPI  PHYSICAL EXAM: BP 118/70 mmHg  Pulse 80  Temp(Src) 98.4 F (36.9  C) (Tympanic)  Ht '5\' 7"'$  (1.702 m)  Wt 253 lb (114.76 kg)  BMI 39.62 kg/m2  LMP 06/08/2015 Well appearing, pleasant female in no distress HEENT: PERRL, EOMI, conjunctiva and sclera are clear.   Nasal mucosa is moderately edematou with light yellow mucus noted in the left nares. OP is clear. TM's and EAC's normal. Sinuses nontender Neck: no lymphadenopathy or mass Heart: regular rate and rhythm, no murmur Lungs: clear bilaterally, no wheezes, rales, ronchi Skin: normal turgor, no rash. Extremities: no edema Neuro: alert and oriented, cranial nerves intact, normal strength, gait Psych: normal mood, affect, hygiene and grooming  ASSESSMENT/PLAN:  Acute upper respiratory infection - Plan: azithromycin (ZITHROMAX) 250 MG tablet   Drink plenty of water. Add mucinex or robitussin (active ingredient should be guaifenesin, the expectorant to loosen the mucus/phlegm)  You may continue decongestants (ie pseudoephedrine or phenylephrine). You may continue to use acetaminophen (tylenol) or use ibuprofen as neeed for any fever or pain. It doesn't seem that your cough is significant at this time. If it worsens, dextromethorphan (the ingredient in Delsym, and most cough medications, DM) can help as a cough suppressant.  Read all your labs on the OTC medications, and make sure you aren't doubling up on any of them.  Consider trying sinus rinses--either the sinus rinse kit or the Neti-pot.  Try this once daily.  If effective and helpful, you can try this up to twice daily.  If your symptoms are getting progressively worse rather than better (facial/sinus pain, fever, ongoing discolored mucus/phlegm), then start the antibiotic.  You must complete the full  course if the antibiotics are started.  Call us if you have a side effect or aren't improving.  If you take the z-pak, remember that while you only take the pills for 5 days, it continues to work for a total of 10 days.  If you aren't 100% better by 10  days, please call and let us know.  If you are partly better, you likely need a refill to start on day 11.  If you aren't better at all (or if you are clearly worse at a week), then call to have the antibiotic changed.

## 2015-06-28 NOTE — Patient Instructions (Addendum)
  Drink plenty of water. Add mucinex or robitussin (active ingredient should be guaifenesin, the expectorant to loosen the mucus/phlegm)  You may continue decongestants (ie pseudoephedrine or phenylephrine). You may continue to use acetaminophen (tylenol) or use ibuprofen as neeed for any fever or pain. It doesn't seem that your cough is significant at this time. If it worsens, dextromethorphan (the ingredient in Delsym, and most cough medications, DM) can help as a cough suppressant.  Read all your labs on the OTC medications, and make sure you aren't doubling up on any of them.  Consider trying sinus rinses--either the sinus rinse kit or the Neti-pot.  Try this once daily.  If effective and helpful, you can try this up to twice daily.  If your symptoms are getting progressively worse rather than better (facial/sinus pain, fever, ongoing discolored mucus/phlegm), then start the antibiotic.  You must complete the full course if the antibiotics are started.  Call us if you have a side effect or aren't improving.   If you take the z-pak, remember that while you only take the pills for 5 days, it continues to work for a total of 10 days.  If you aren't 100% better by 10 days, please call and let us know.  If you are partly better, you likely need a refill to start on day 11.  If you aren't better at all (or if you are clearly worse at a week), then call to have the antibiotic changed.

## 2015-11-28 ENCOUNTER — Telehealth: Payer: Self-pay

## 2015-11-28 NOTE — Telephone Encounter (Signed)
Qsymia 3.75-23mg  and 7.5-46mg  script has not been picked up from front desk. Script destroyed and placed in shred bin. Trixie Rude/RLB

## 2016-01-07 IMAGING — CR DG ABDOMEN ACUTE W/ 1V CHEST
3 series · 3 of 3 positions shown · non-contrast
Comparison: None.

CLINICAL DATA: Acute onset of abdominal pain, predominantly at the
left lower quadrant. Lack of bowel movements for 1 week. Vomiting.
Initial encounter.

EXAM:
ACUTE ABDOMEN SERIES (ABDOMEN 2 VIEW & CHEST 1 VIEW)

[w chest pa]
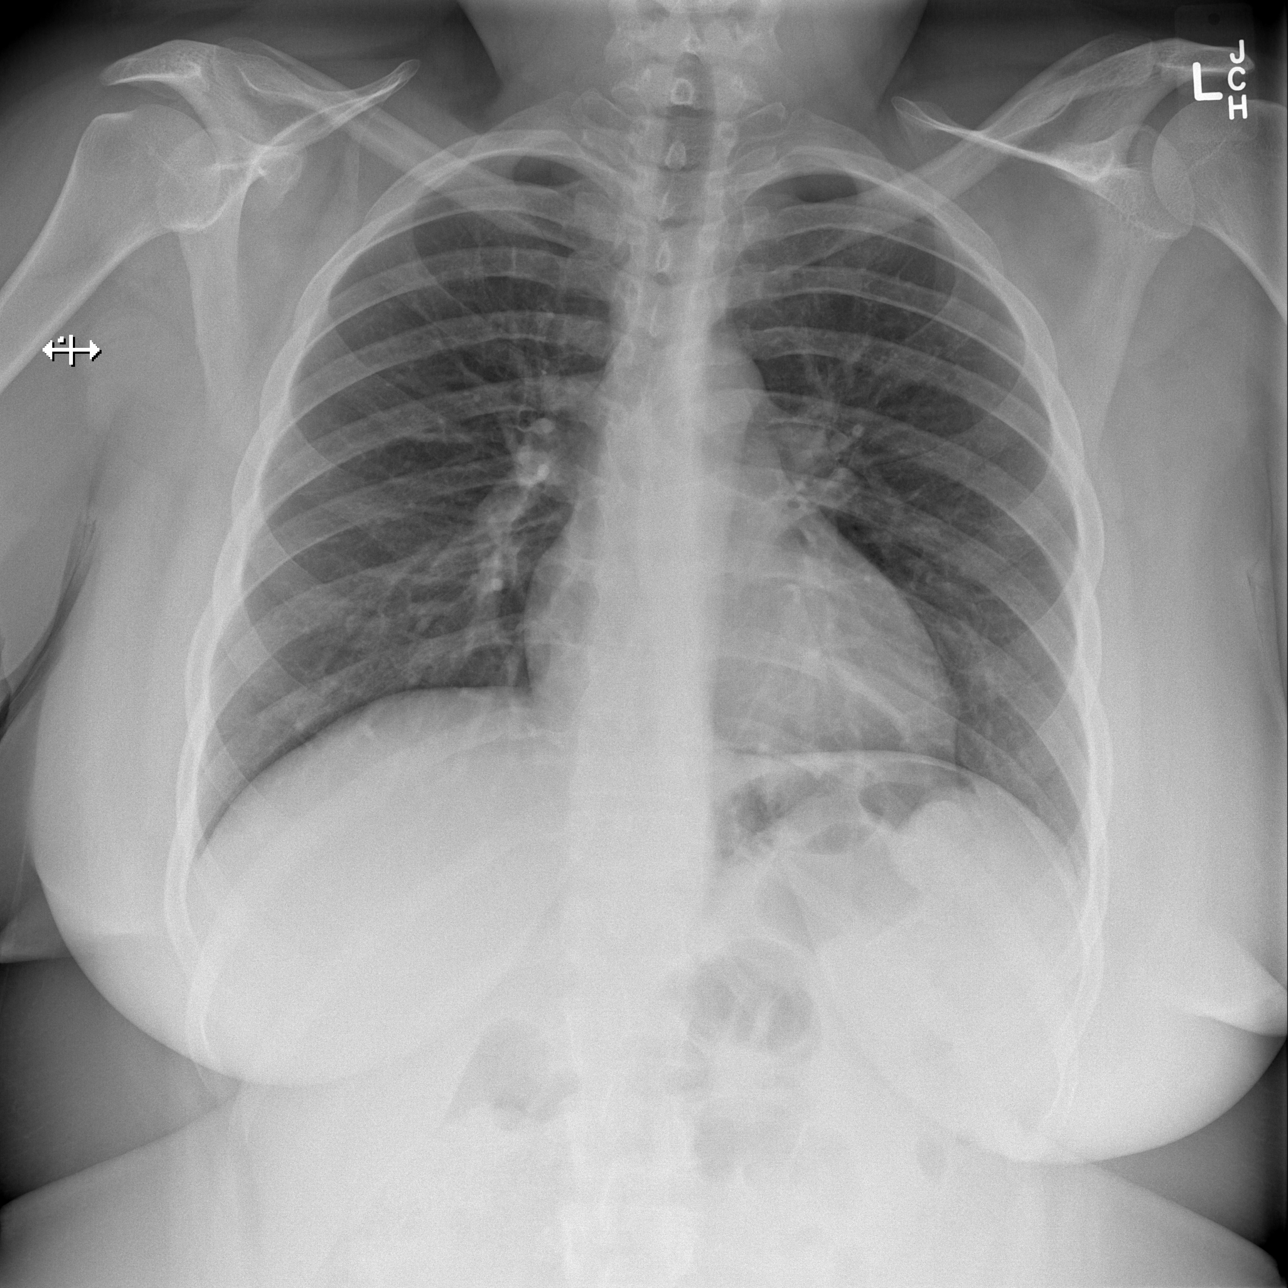

[w abdomen upright]
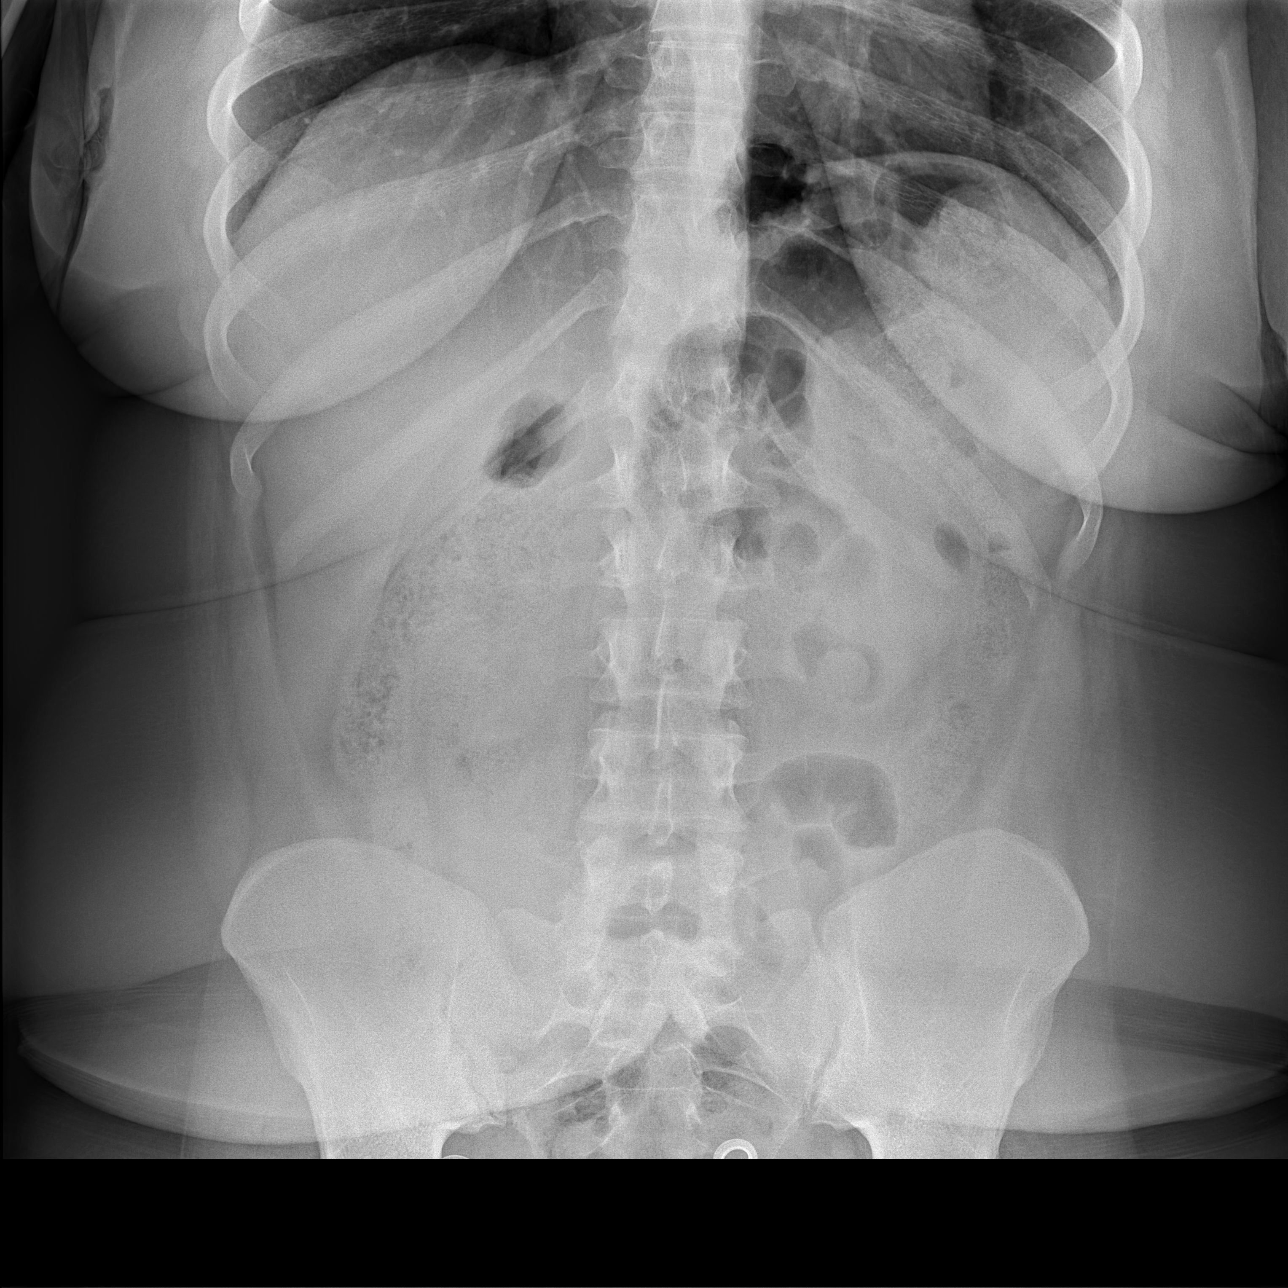

[t abdomen supine]
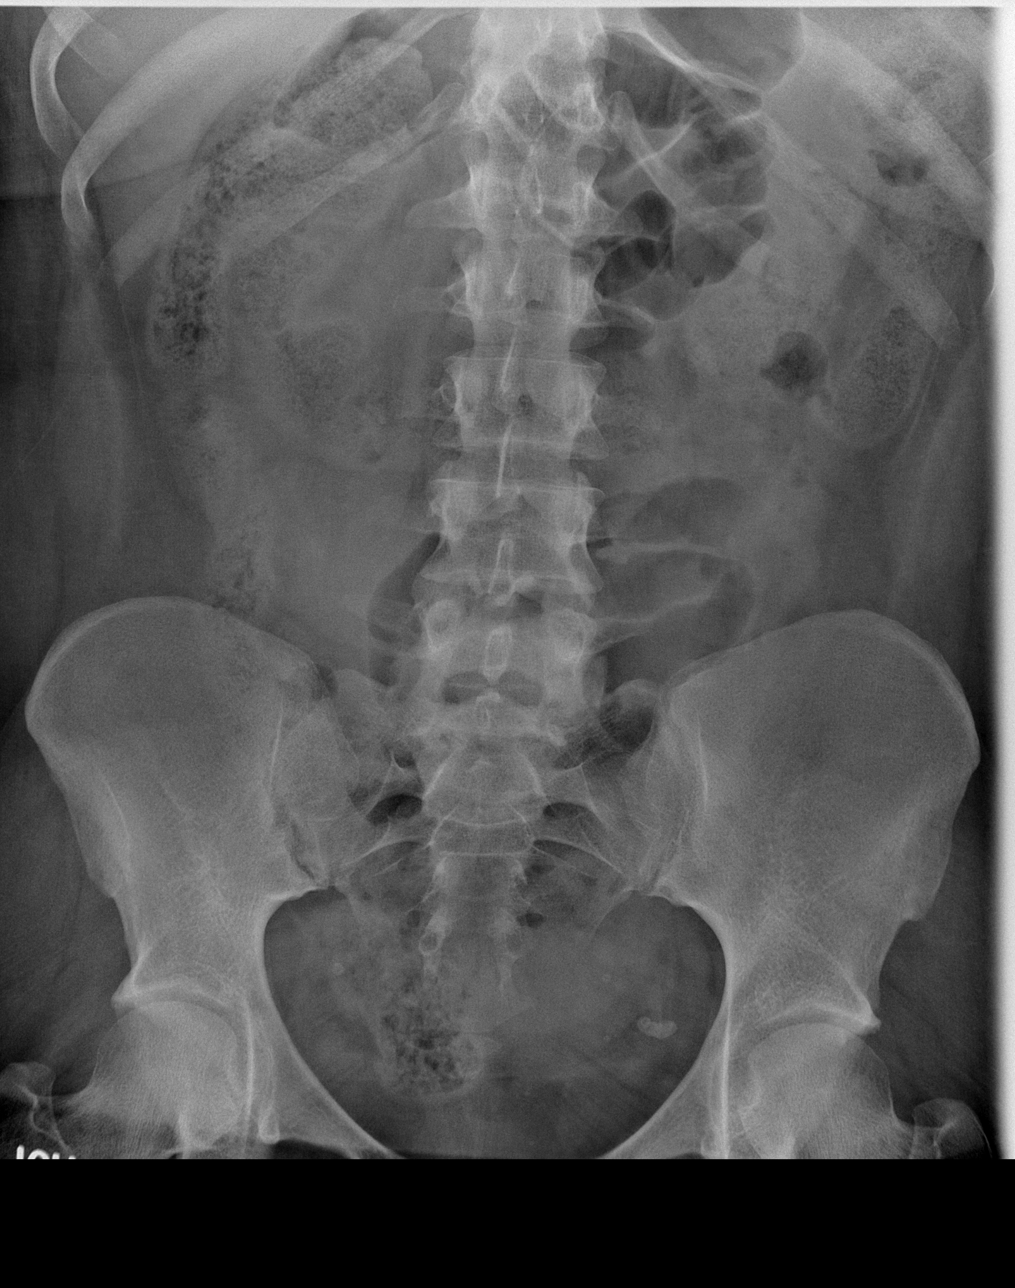

[3 of 3 positions shown; findings below may reference images not displayed]

FINDINGS: The lungs are well-aerated and clear. There is no evidence of focal
opacification, pleural effusion or pneumothorax. The
cardiomediastinal silhouette is within normal limits.

The visualized bowel gas pattern is unremarkable. Scattered stool
and air are seen within the colon; there is no evidence of small
bowel dilatation to suggest obstruction. No free intra-abdominal air
is identified on the provided upright view.

No acute osseous abnormalities are seen; the sacroiliac joints are
unremarkable in appearance.
IMPRESSION: 1. Unremarkable bowel gas pattern; no free intra-abdominal air seen.
2. No acute cardiopulmonary process seen.

## 2016-03-31 LAB — HM PAP SMEAR: HM Pap smear: NEGATIVE

## 2016-08-06 ENCOUNTER — Encounter: Payer: BLUE CROSS/BLUE SHIELD | Admitting: Medical

## 2016-08-25 ENCOUNTER — Ambulatory Visit (INDEPENDENT_AMBULATORY_CARE_PROVIDER_SITE_OTHER): Payer: BLUE CROSS/BLUE SHIELD | Admitting: Medical

## 2016-08-25 ENCOUNTER — Encounter: Payer: Self-pay | Admitting: Medical

## 2016-08-25 VITALS — BP 118/80 | HR 78 | Resp 14 | Ht 67.0 in | Wt 248.0 lb

## 2016-08-25 DIAGNOSIS — E282 Polycystic ovarian syndrome: Secondary | ICD-10-CM | POA: Diagnosis not present

## 2016-08-25 DIAGNOSIS — IMO0001 Reserved for inherently not codable concepts without codable children: Secondary | ICD-10-CM | POA: Insufficient documentation

## 2016-08-25 DIAGNOSIS — E669 Obesity, unspecified: Secondary | ICD-10-CM

## 2016-08-25 DIAGNOSIS — K5900 Constipation, unspecified: Secondary | ICD-10-CM | POA: Diagnosis not present

## 2016-08-25 DIAGNOSIS — H409 Unspecified glaucoma: Secondary | ICD-10-CM | POA: Diagnosis not present

## 2016-08-25 DIAGNOSIS — Z0000 Encounter for general adult medical examination without abnormal findings: Secondary | ICD-10-CM | POA: Diagnosis not present

## 2016-08-25 DIAGNOSIS — Z6838 Body mass index (BMI) 38.0-38.9, adult: Secondary | ICD-10-CM

## 2016-08-25 DIAGNOSIS — K921 Melena: Secondary | ICD-10-CM | POA: Insufficient documentation

## 2016-08-25 LAB — COMPREHENSIVE METABOLIC PANEL
ALT: 6 U/L (ref 6–29)
AST: 11 U/L (ref 10–30)
Albumin: 3.9 g/dL (ref 3.6–5.1)
Alkaline Phosphatase: 90 U/L (ref 33–115)
BILIRUBIN TOTAL: 0.2 mg/dL (ref 0.2–1.2)
BUN: 14 mg/dL (ref 7–25)
CO2: 24 mmol/L (ref 20–32)
Calcium: 9.1 mg/dL (ref 8.6–10.2)
Chloride: 107 mmol/L (ref 98–110)
Creat: 0.94 mg/dL (ref 0.50–1.10)
GLUCOSE: 71 mg/dL (ref 65–99)
Potassium: 3.9 mmol/L (ref 3.5–5.3)
Sodium: 141 mmol/L (ref 135–146)
Total Protein: 7.4 g/dL (ref 6.1–8.1)

## 2016-08-25 LAB — POCT URINALYSIS DIP (PROADVANTAGE DEVICE)
BILIRUBIN UA: NEGATIVE
Glucose, UA: NEGATIVE mg/dL
Ketones, POC UA: NEGATIVE mg/dL
LEUKOCYTES UA: NEGATIVE
NITRITE UA: NEGATIVE
PH UA: 6 (ref 5.0–8.0)
Protein Ur, POC: NEGATIVE mg/dL
RBC UA: NEGATIVE
Specific Gravity, Urine: 1.03
UUROB: NEGATIVE

## 2016-08-25 LAB — CBC WITH DIFFERENTIAL/PLATELET
BASOS ABS: 0 {cells}/uL (ref 0–200)
Basophils Relative: 0 %
EOS PCT: 1 %
Eosinophils Absolute: 60 cells/uL (ref 15–500)
HCT: 37.5 % (ref 35.0–45.0)
Hemoglobin: 12 g/dL (ref 11.7–15.5)
Lymphocytes Relative: 24 %
Lymphs Abs: 1440 cells/uL (ref 850–3900)
MCH: 22.8 pg — ABNORMAL LOW (ref 27.0–33.0)
MCHC: 32 g/dL (ref 32.0–36.0)
MCV: 71.2 fL — AB (ref 80.0–100.0)
MONOS PCT: 8 %
MPV: 9.1 fL (ref 7.5–12.5)
Monocytes Absolute: 480 cells/uL (ref 200–950)
Neutro Abs: 4020 cells/uL (ref 1500–7800)
Neutrophils Relative %: 67 %
Platelets: 297 10*3/uL (ref 140–400)
RBC: 5.27 MIL/uL — ABNORMAL HIGH (ref 3.80–5.10)
RDW: 15.3 % — ABNORMAL HIGH (ref 11.0–15.0)
WBC: 6 10*3/uL (ref 4.0–10.5)

## 2016-08-25 LAB — TSH: TSH: 0.64 mIU/L

## 2016-08-25 MED ORDER — LINACLOTIDE 145 MCG PO CAPS: 145.0000 ug | ORAL_CAPSULE | Freq: Every day | ORAL | 0 refills | Status: DC

## 2016-08-25 NOTE — Patient Instructions (Signed)
Recommendations: See your eye doctor yearly for routine vision care.  See your dentist yearly for routine dental care including hygiene visits twice yearly.  See your gynecologist yearly for routine gynecological care.   Constipation, Adult Constipation is when a person has fewer bowel movements in a week than normal, has difficulty having a bowel movement, or has stools that are dry, hard, or larger than normal. Constipation may be caused by an underlying condition. It may become worse with age if a person takes certain medicines and does not take in enough fluids. Follow these instructions at home: Eating and drinking   Eat foods that have a lot of fiber, such as fresh fruits and vegetables, whole grains, and beans.  Limit foods that are high in fat, low in fiber, or overly processed, such as french fries, hamburgers, cookies, candies, and soda.  Drink enough fluid to keep your urine clear or pale yellow. General instructions  Exercise regularly or as told by your health care provider.  Go to the restroom when you have the urge to go. Do not hold it in.  Take over-the-counter and prescription medicines only as told by your health care provider. These include any fiber supplements.  Practice pelvic floor retraining exercises, such as deep breathing while relaxing the lower abdomen and pelvic floor relaxation during bowel movements.  Watch your condition for any changes.  Keep all follow-up visits as told by your health care provider. This is important. Contact a health care provider if:  You have pain that gets worse.  You have a fever.  You do not have a bowel movement after 4 days.  You vomit.  You are not hungry.  You lose weight.  You are bleeding from the anus.  You have thin, pencil-like stools. Get help right away if:  You have a fever and your symptoms suddenly get worse.  You leak stool or have blood in your stool.  Your abdomen is bloated.  You have  severe pain in your abdomen.  You feel dizzy or you faint. This information is not intended to replace advice given to you by your health care provider. Make sure you discuss any questions you have with your health care provider. Document Released: 09/28/2003 Document Revised: 07/20/2015 Document Reviewed: 06/20/2015 Elsevier Interactive Patient Education  2017 [employer]

## 2016-08-25 NOTE — Progress Notes (Signed)
Subjective:   HPI  Anita [name] is a 31 y.o. female who presents for a complete physical.  Medical team See your eye doctor yearly for routine vision care. See your dentist yearly for routine dental care including hygiene visits twice yearly. See your [facility].  Concerns: Has had some bad constipation for months or longer.   Occasional blood in stool with hemorrhoids, but had colonoscopy in the past for same issue.   Has frequent urination, somewhat of a nuisance.  Has cut back on soda.     Concerned about cancer screening given family history.  Reviewed their medical, surgical, family, social, medication, and allergy history and updated chart as appropriate.  Past Medical History:  Diagnosis Date  . Family history of clotting disorder    father  . Glaucoma 2012   [facility]  . Hemorrhoids   . Insomnia 04/2014   associated with loss of significant other  . Nexplanon in place 2015   Dr. Lynden AngElizabeth Lane, [facility]; irregular periods prior to Nexplanon  . Obesity   . PCOS (polycystic ovarian syndrome) 2009  . Urinary tract infection   . Wears glasses     Past Surgical History:  Procedure Laterality Date  . COLONOSCOPY  2009   normal, due to irregular stool, blood in stool.  [facility]    Social History   Social History  . Marital status: Single    Spouse name: N/A  . Number of children: N/A  . Years of education: N/A   Occupational History  . Not on file.   Social History Main Topics  . Smoking status: Never Smoker  . Smokeless tobacco: Never Used  . Alcohol use 0.0 oz/week     Comment: occasional  . Drug use: No  . Sexual activity: Not on file   Other Topics Concern  . Not on file   Social History Narrative   Lives with son, Anita [name], exercise - not much, 08/2016.  Plans to start walking.  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 Diabetes Mother   . Hypertension Mother   . Hyperlipidemia Mother   . Hyperlipidemia Father   . Heart disease Father 40       mural thrombis  . Deep vein thrombosis Father   . Dementia Maternal Grandmother   . Hypertension Maternal Grandmother   . Stroke Maternal Grandmother   . COPD Maternal Grandfather        emphysema  . Hypertension Paternal Grandmother   . Cancer Maternal Aunt 49       colon  . Cancer Maternal Uncle 60       pancreatic     Current Outpatient Prescriptions:  .  etonogestrel (NEXPLANON) 68 MG IMPL implant, 1 each by Subdermal route once., Disp: , Rfl:  .  Travoprost, BAK Free, (TRAVATAN) 0.004 % SOLN ophthalmic solution, Place 1 drop into both eyes at bedtime. , Disp: , Rfl:  .  linaclotide (LINZESS) 145 MCG CAPS capsule, Take 1 capsule (145 mcg total) by mouth daily before breakfast., Disp: 20 capsule, Rfl: 0  No Known Allergies    Review of Systems Constitutional: -fever, -chills, -sweats, -unexpected weight change, -decreased appetite, -fatigue Allergy: -sneezing, -itching, -congestion Dermatology: -changing moles, --rash, -lumps ENT: -runny nose, -ear pain, -sore throat, -hoarseness, -sinus pain, -teeth pain, - ringing in ears, -hearing loss, -nosebleeds Cardiology: -chest pain, -palpitations, -  swelling, -difficulty breathing when lying flat, -waking up short of breath Respiratory: -cough, -shortness of breath, -difficulty breathing with exercise or exertion, -wheezing, -coughing up blood Gastroenterology: -abdominal pain, -nausea, -vomiting, -diarrhea, +constipation, -blood in stool, -changes in bowel movement, -difficulty swallowing or eating Hematology: -bleeding, -bruising  Musculoskeletal: -joint aches, -muscle aches, -joint swelling, -back pain, -neck pain, -cramping, -changes in gait Ophthalmology: denies vision changes, eye redness, itching, discharge Urology: -burning with urination, -difficulty urinating, -blood in urine, -urinary  frequency, -urgency, -incontinence Neurology: -headache, -weakness, -tingling, -numbness, -memory loss, -falls, -dizziness Psychology: -depressed mood, -agitation, -sleep problems     Objective:   Physical Exam  BP 118/80   Pulse 78   Resp 14   Ht 5\' 7"  (1.702 m)   Wt 248 lb (112.5 kg)   LMP 06/19/2016   SpO2 96%   BMI 38.84 kg/m   General appearance: alert, no distress, WD/WN, obese AA female Skin: few scattered benign macules on torso, striae of abdomen, upper arms HEENT: normocephalic, conjunctiva/corneas normal, sclerae anicteric, PERRLA, EOMi, nares patent, no discharge or erythema, pharynx normal Oral cavity: MMM, tongue normal, teeth normal Neck: supple, no lymphadenopathy, no thyromegaly, no masses, normal ROM, no bruits Chest: non tender, normal shape and expansion Heart: RRR, normal S1, S2, no murmurs Lungs: CTA bilaterally, no wheezes, rhonchi, or rales Abdomen: +bs, soft, non tender, non distended, no masses, no hepatomegaly, no splenomegaly, no bruits Back: non tender, normal ROM, no scoliosis Musculoskeletal: upper extremities non tender, no obvious deformity, normal ROM throughout, lower extremities non tender, no obvious deformity, normal ROM throughout Extremities: no edema, no cyanosis, no clubbing Pulses: 2+ symmetric, upper and lower extremities, normal cap refill Neurological: alert, oriented x 3, CN2-12 intact, strength normal upper extremities and lower extremities, sensation normal throughout, DTRs 2+ throughout, no cerebellar signs, gait normal Psychiatric: normal affect, behavior normal, pleasant  Breast/gyn - deferred to gyn Anus normal appearing - exam chaperoned by nurse    Assessment and Plan :    Encounter Diagnoses  Name Primary?  . Routine general medical examination at a health care facility Yes  . Glaucoma, unspecified glaucoma type, unspecified laterality   . PCOS (polycystic ovarian syndrome)   . Constipation, unspecified constipation  type   . Blood in stool   . Class 2 obesity with serious comorbidity and body mass index (BMI) of 38.0 to 38.9 in adult, unspecified obesity type     Physical exam - discussed healthy lifestyle, diet, exercise, preventative care, vaccinations, and addressed their concerns.  Handout given. See eye doctor, dentist and gynecologist yearly Discussed obesity, weight loss efforts, exercise, goal setting, diet glaucoma - on medication, managed by eye doctor PCOS - managed by gyn Vaccines - advised yearly flu vaccine Contraception - on nexplanon Constipation - begin trial of Linzess.  Discussed fiber and water intake.   F/u 2-3 wk. Follow-up pending labs

## 2016-08-26 ENCOUNTER — Telehealth: Payer: Self-pay | Admitting: Medical

## 2016-08-26 LAB — HEMOGLOBIN A1C
Hgb A1c MFr Bld: 5.1 % (ref <5.7)
Mean Plasma Glucose: 100 mg/dL

## 2016-08-26 NOTE — Telephone Encounter (Signed)
Received requested pap from [facility] 09/26/2016. Sending back to be reviewed.

## 2016-09-02 ENCOUNTER — Encounter: Payer: Self-pay | Admitting: Medical

## 2016-09-08 ENCOUNTER — Other Ambulatory Visit: Payer: BLUE CROSS/BLUE SHIELD

## 2016-09-08 DIAGNOSIS — Z0000 Encounter for general adult medical examination without abnormal findings: Secondary | ICD-10-CM

## 2016-09-09 LAB — LIPID PANEL
CHOLESTEROL: 143 mg/dL (ref <200)
HDL: 47 mg/dL — ABNORMAL LOW (ref >50)
LDL Cholesterol: 87 mg/dL (ref <100)
Total CHOL/HDL Ratio: 3 Ratio (ref <5.0)
Triglycerides: 44 mg/dL (ref <150)
VLDL: 9 mg/dL (ref <30)

## 2016-12-25 ENCOUNTER — Ambulatory Visit: Payer: BLUE CROSS/BLUE SHIELD | Admitting: Medical

## 2017-02-08 ENCOUNTER — Encounter (HOSPITAL_COMMUNITY): Payer: Self-pay | Admitting: Emergency Medicine

## 2017-02-08 ENCOUNTER — Emergency Department (HOSPITAL_COMMUNITY)
Admission: EM | Admit: 2017-02-08 | Discharge: 2017-02-08 | Disposition: A | Discharge status: Home / Self Care | Payer: Commercial Managed Care - PPO | Attending: Emergency Medicine | Admitting: Emergency Medicine

## 2017-02-08 ENCOUNTER — Emergency Department (HOSPITAL_COMMUNITY): Payer: Commercial Managed Care - PPO

## 2017-02-08 DIAGNOSIS — J069 Acute upper respiratory infection, unspecified: Secondary | ICD-10-CM | POA: Diagnosis not present

## 2017-02-08 DIAGNOSIS — R05 Cough: Secondary | ICD-10-CM | POA: Diagnosis not present

## 2017-02-08 DIAGNOSIS — R0981 Nasal congestion: Secondary | ICD-10-CM | POA: Diagnosis not present

## 2017-02-08 DIAGNOSIS — Z79899 Other long term (current) drug therapy: Secondary | ICD-10-CM | POA: Diagnosis not present

## 2017-02-08 MED ORDER — SALINE SPRAY 0.65 % NA SOLN: 1.0000 | NASAL | 0 refills | Status: DC | PRN

## 2017-02-08 MED ORDER — ACETAMINOPHEN 500 MG PO TABS
1000.0000 mg | ORAL_TABLET | Freq: Once | ORAL | Status: AC
  Administered 2017-02-08: 1000 mg via ORAL
  Filled 2017-02-08: qty 2

## 2017-02-08 MED ORDER — PROMETHAZINE-DM 6.25-15 MG/5ML PO SYRP: 5.0000 mL | ORAL_SOLUTION | Freq: Four times a day (QID) | ORAL | 0 refills | Status: DC | PRN

## 2017-02-08 NOTE — ED Notes (Signed)
Pt reports that she has drank sprite and water and has been able to hold it down

## 2017-02-08 NOTE — ED Provider Notes (Signed)
[facility]-EMERGENCY DEPT Provider Note   CSN: [id number] Arrival date & time: 02/08/17  1506     History   Chief Complaint Chief Complaint  Patient presents with  . Cough  . Back Pain  . Fever    HPI Anita [name] is a 32 y.o. female with no pertinent past medical history presenting with 1 month of congestion which improved after being treated with antibiotics approximately 1 month ago.  She was well for a week and then started to experience subjective fevers and chills with cough for the last couple of days.  Denies any known ill contacts.  She has tried TheraFlu, Sudafed, humidifier without relief.  No myalgias, nausea, vomiting, diarrhea, abdominal pain. Denies any recent travel, has not had a flu shot this year.  HPI  Past Medical History:  Diagnosis Date  . Family history of clotting disorder    father  . Glaucoma 2012   Dr. Velna Ochs, [facility]  . Hemorrhoids   . Insomnia 04/2014   associated with loss of significant other  . Nexplanon in place 2015   Dr. Lynden Ang, [facility]; irregular periods prior to Nexplanon  . Obesity   . PCOS (polycystic ovarian syndrome) 2009  . Urinary tract infection   . Wears glasses     Patient Active Problem List   Diagnosis Date Noted  . Routine general medical examination at a health care facility 08/25/2016  . PCOS (polycystic ovarian syndrome) 08/25/2016  . Constipation 08/25/2016  . Blood in stool 08/25/2016  . Class 2 obesity with serious comorbidity and body mass index (BMI) of 38.0 to 38.9 in adult 08/25/2016  . Glaucoma 08/28/2011    Past Surgical History:  Procedure Laterality Date  . COLONOSCOPY  2009   normal, due to irregular stool, blood in stool.  [facility]    OB History    No data available       Home Medications    Prior to Admission medications   Medication Sig Start Date End Date Taking? Authorizing Provider  etonogestrel (NEXPLANON) 68 MG IMPL implant 1  each by Subdermal route once.    [provider]  linaclotide Karlene Einstein) 145 MCG CAPS capsule Take 1 capsule (145 mcg total) by mouth daily before breakfast. 08/25/16   Tysinger, Kermit Balo, PA-C  promethazine-dextromethorphan (PROMETHAZINE-DM) 6.25-15 MG/5ML syrup Take 5 mLs by mouth 4 (four) times daily as needed for cough. 02/08/17   Mathews Robinsons B, PA-C  sodium chloride (OCEAN) 0.65 % SOLN nasal spray Place 1 spray into both nostrils as needed for congestion. 02/08/17   Mathews Robinsons B, PA-C  Travoprost, BAK Free, (TRAVATAN) 0.004 % SOLN ophthalmic solution Place 1 drop into both eyes at bedtime.     [provider]    Family History Family History  Problem Relation Age of Onset  . Diabetes Mother   . Hypertension Mother   . Hyperlipidemia Mother   . Hyperlipidemia Father   . Heart disease Father 40       mural thrombis  . Deep vein thrombosis Father   . Dementia Maternal Grandmother   . Hypertension Maternal Grandmother   . Stroke Maternal Grandmother   . COPD Maternal Grandfather        emphysema  . Hypertension Paternal Grandmother   . Cancer Maternal Aunt 49       colon  . Cancer Maternal Uncle 15       pancreatic    Social History Social History  Tobacco Use  . Smoking status: Never Smoker  . Smokeless tobacco: Never Used  Substance Use Topics  . Alcohol use: Yes    Alcohol/week: 0.0 oz    Comment: occasional  . Drug use: No     Allergies   Patient has no known allergies.   Review of Systems Review of Systems  Constitutional: Positive for chills and fever. Negative for diaphoresis.  HENT: Positive for congestion. Negative for ear pain, facial swelling, sinus pain, sore throat, tinnitus, trouble swallowing and voice change.   Eyes: Negative for visual disturbance.  Respiratory: Positive for cough and shortness of breath. Negative for choking, chest tightness, wheezing and stridor.        Been feeling more short of breath with exertion  with this congestion and cough and having difficulty sleeping  Cardiovascular: Negative for chest pain, palpitations and leg swelling.  Gastrointestinal: Negative for abdominal pain, nausea and vomiting.  Genitourinary: Negative for dysuria, flank pain and hematuria.  Musculoskeletal: Negative for arthralgias, gait problem, joint swelling, myalgias, neck pain and neck stiffness.  Skin: Negative for color change, pallor and rash.  Neurological: Negative for dizziness, weakness, light-headedness and headaches.  Psychiatric/Behavioral: Negative for behavioral problems.     Physical Exam Updated Vital Signs BP (!) 141/92   Pulse 93   Temp 99.9 F (37.7 C) (Oral)   Resp 17   SpO2 95%   Physical Exam  Constitutional: She appears well-developed and well-nourished. No distress.  Patient initially febrile in the emergency department, nontoxic, sitting comfortably in chair in no acute distress.  HENT:  Head: Normocephalic and atraumatic.  Mouth/Throat: Oropharynx is clear and moist. No oropharyngeal exudate.  Eyes: Conjunctivae and EOM are normal. Right eye exhibits no discharge. Left eye exhibits no discharge.  Neck: Normal range of motion. Neck supple.  Cardiovascular: Normal rate, regular rhythm and normal heart sounds.  No murmur heard. Pulmonary/Chest: Effort normal and breath sounds normal. No stridor. No respiratory distress. She has no wheezes. She has no rales.  Abdominal: She exhibits no distension.  Musculoskeletal: Normal range of motion. She exhibits no edema.  Lymphadenopathy:    She has no cervical adenopathy.  Neurological: She is alert.  Skin: Skin is warm and dry. No rash noted. She is not diaphoretic. No erythema. No pallor.  Psychiatric: She has a normal mood and affect.  Nursing note and vitals reviewed.    ED Treatments / Results  Labs (all labs ordered are listed, but only abnormal results are displayed) Labs Reviewed - No data to display  EKG  EKG  Interpretation None       Radiology Dg Chest 2 View  Result Date: 02/08/2017 CLINICAL DATA:  Cough and congestion for 1 week EXAM: CHEST  2 VIEW COMPARISON:  10/21/2013 FINDINGS: The heart size and mediastinal contours are within normal limits. Both lungs are clear. The visualized skeletal structures are unremarkable. IMPRESSION: No active cardiopulmonary disease. Electronically Signed   By: Alcide Clever M.D.   On: 02/08/2017 15:59    Procedures Procedures (including critical care time)  Medications Ordered in ED Medications  acetaminophen (TYLENOL) tablet 1,000 mg (1,000 mg Oral Given 02/08/17 1836)     Initial Impression / Assessment and Plan / ED Course  I have reviewed the triage vital signs and the nursing notes.  Pertinent labs & imaging results that were available during my care of the patient were reviewed by me and considered in my medical decision making (see chart for details).   Patient overall  improved in the emergency department.  Given Tylenol with resolution of fever.  Pt CXR negative for acute infiltrate. Patients symptoms are consistent with URI, likely viral etiology. Discussed that antibiotics are not indicated for viral infections. Pt will be discharged with symptomatic treatment.  Verbalizes understanding and is agreeable with plan. Pt is hemodynamically stable & in NAD prior to dc.  Final Clinical Impressions(s) / ED Diagnoses   Final diagnoses:  URI with cough and congestion    ED Discharge Orders        Ordered    promethazine-dextromethorphan (PROMETHAZINE-DM) 6.25-15 MG/5ML syrup  4 times daily PRN     02/08/17 1929    sodium chloride (OCEAN) 0.65 % SOLN nasal spray  As needed     02/08/17 1929       Gregary CromerMitchell, Tranika Scholler B, PA-C 02/08/17 2314    Raeford RazorKohut, Stephen, MD 02/08/17 2356

## 2017-02-08 NOTE — ED Triage Notes (Signed)
Patient here with complaints of flu like symptoms. Seen at Urgent Care for back pain and was started on antibiotics for UTI. Cough, nasal congestion, fever x1 week.

## 2017-02-08 NOTE — Discharge Instructions (Signed)
As discussed, make sure that you stay well-hydrated drinking enough fluids to keep your urine clear.  Take cough medication as needed.  Do not drive or operate machinery while on this medication.  Use the nasal spray to help with congestion.  Alternate between Tylenol and ibuprofen for fever or pain. Follow-up with your primary care provider.  Return sooner if symptoms worsen, difficulty breathing, chest pain, or new concerning symptoms in the meantime.

## 2017-02-11 DIAGNOSIS — H401131 Primary open-angle glaucoma, bilateral, mild stage: Secondary | ICD-10-CM | POA: Diagnosis not present

## 2017-10-02 DIAGNOSIS — Z6841 Body Mass Index (BMI) 40.0 and over, adult: Secondary | ICD-10-CM | POA: Diagnosis not present

## 2017-10-02 DIAGNOSIS — Z0000 Encounter for general adult medical examination without abnormal findings: Secondary | ICD-10-CM | POA: Diagnosis not present

## 2017-10-02 DIAGNOSIS — Z118 Encounter for screening for other infectious and parasitic diseases: Secondary | ICD-10-CM | POA: Diagnosis not present

## 2017-10-02 DIAGNOSIS — Z23 Encounter for immunization: Secondary | ICD-10-CM | POA: Diagnosis not present

## 2017-10-02 DIAGNOSIS — N760 Acute vaginitis: Secondary | ICD-10-CM | POA: Diagnosis not present

## 2017-10-02 DIAGNOSIS — Z01419 Encounter for gynecological examination (general) (routine) without abnormal findings: Secondary | ICD-10-CM | POA: Diagnosis not present

## 2017-10-16 DIAGNOSIS — Z23 Encounter for immunization: Secondary | ICD-10-CM | POA: Diagnosis not present

## 2017-10-16 DIAGNOSIS — Z6841 Body Mass Index (BMI) 40.0 and over, adult: Secondary | ICD-10-CM | POA: Diagnosis not present

## 2017-10-16 DIAGNOSIS — Z0000 Encounter for general adult medical examination without abnormal findings: Secondary | ICD-10-CM | POA: Diagnosis not present

## 2017-11-18 DIAGNOSIS — M76821 Posterior tibial tendinitis, right leg: Secondary | ICD-10-CM | POA: Diagnosis not present

## 2017-11-18 DIAGNOSIS — M76822 Posterior tibial tendinitis, left leg: Secondary | ICD-10-CM | POA: Diagnosis not present

## 2017-11-18 DIAGNOSIS — M722 Plantar fascial fibromatosis: Secondary | ICD-10-CM | POA: Diagnosis not present

## 2017-11-18 DIAGNOSIS — M7732 Calcaneal spur, left foot: Secondary | ICD-10-CM | POA: Diagnosis not present

## 2017-11-18 DIAGNOSIS — M7731 Calcaneal spur, right foot: Secondary | ICD-10-CM | POA: Diagnosis not present

## 2017-11-25 DIAGNOSIS — M722 Plantar fascial fibromatosis: Secondary | ICD-10-CM | POA: Diagnosis not present

## 2017-12-14 DIAGNOSIS — H401131 Primary open-angle glaucoma, bilateral, mild stage: Secondary | ICD-10-CM | POA: Diagnosis not present

## 2018-02-08 DIAGNOSIS — H401131 Primary open-angle glaucoma, bilateral, mild stage: Secondary | ICD-10-CM | POA: Diagnosis not present

## 2018-02-22 DIAGNOSIS — R350 Frequency of micturition: Secondary | ICD-10-CM | POA: Diagnosis not present

## 2018-02-22 DIAGNOSIS — Z01419 Encounter for gynecological examination (general) (routine) without abnormal findings: Secondary | ICD-10-CM | POA: Diagnosis not present

## 2018-02-22 DIAGNOSIS — Z6841 Body Mass Index (BMI) 40.0 and over, adult: Secondary | ICD-10-CM | POA: Diagnosis not present

## 2018-04-16 DIAGNOSIS — D509 Iron deficiency anemia, unspecified: Secondary | ICD-10-CM | POA: Diagnosis not present

## 2018-04-16 DIAGNOSIS — Z3046 Encounter for surveillance of implantable subdermal contraceptive: Secondary | ICD-10-CM | POA: Diagnosis not present

## 2018-04-16 DIAGNOSIS — Z3009 Encounter for other general counseling and advice on contraception: Secondary | ICD-10-CM | POA: Diagnosis not present

## 2018-04-16 DIAGNOSIS — R300 Dysuria: Secondary | ICD-10-CM | POA: Diagnosis not present

## 2019-04-27 IMAGING — CR DG CHEST 2V
2 series · 2 of 2 positions shown · non-contrast
Comparison: 10/21/2013

CLINICAL DATA: Cough and congestion for 1 week

EXAM:
CHEST  2 VIEW

[w chest pa]
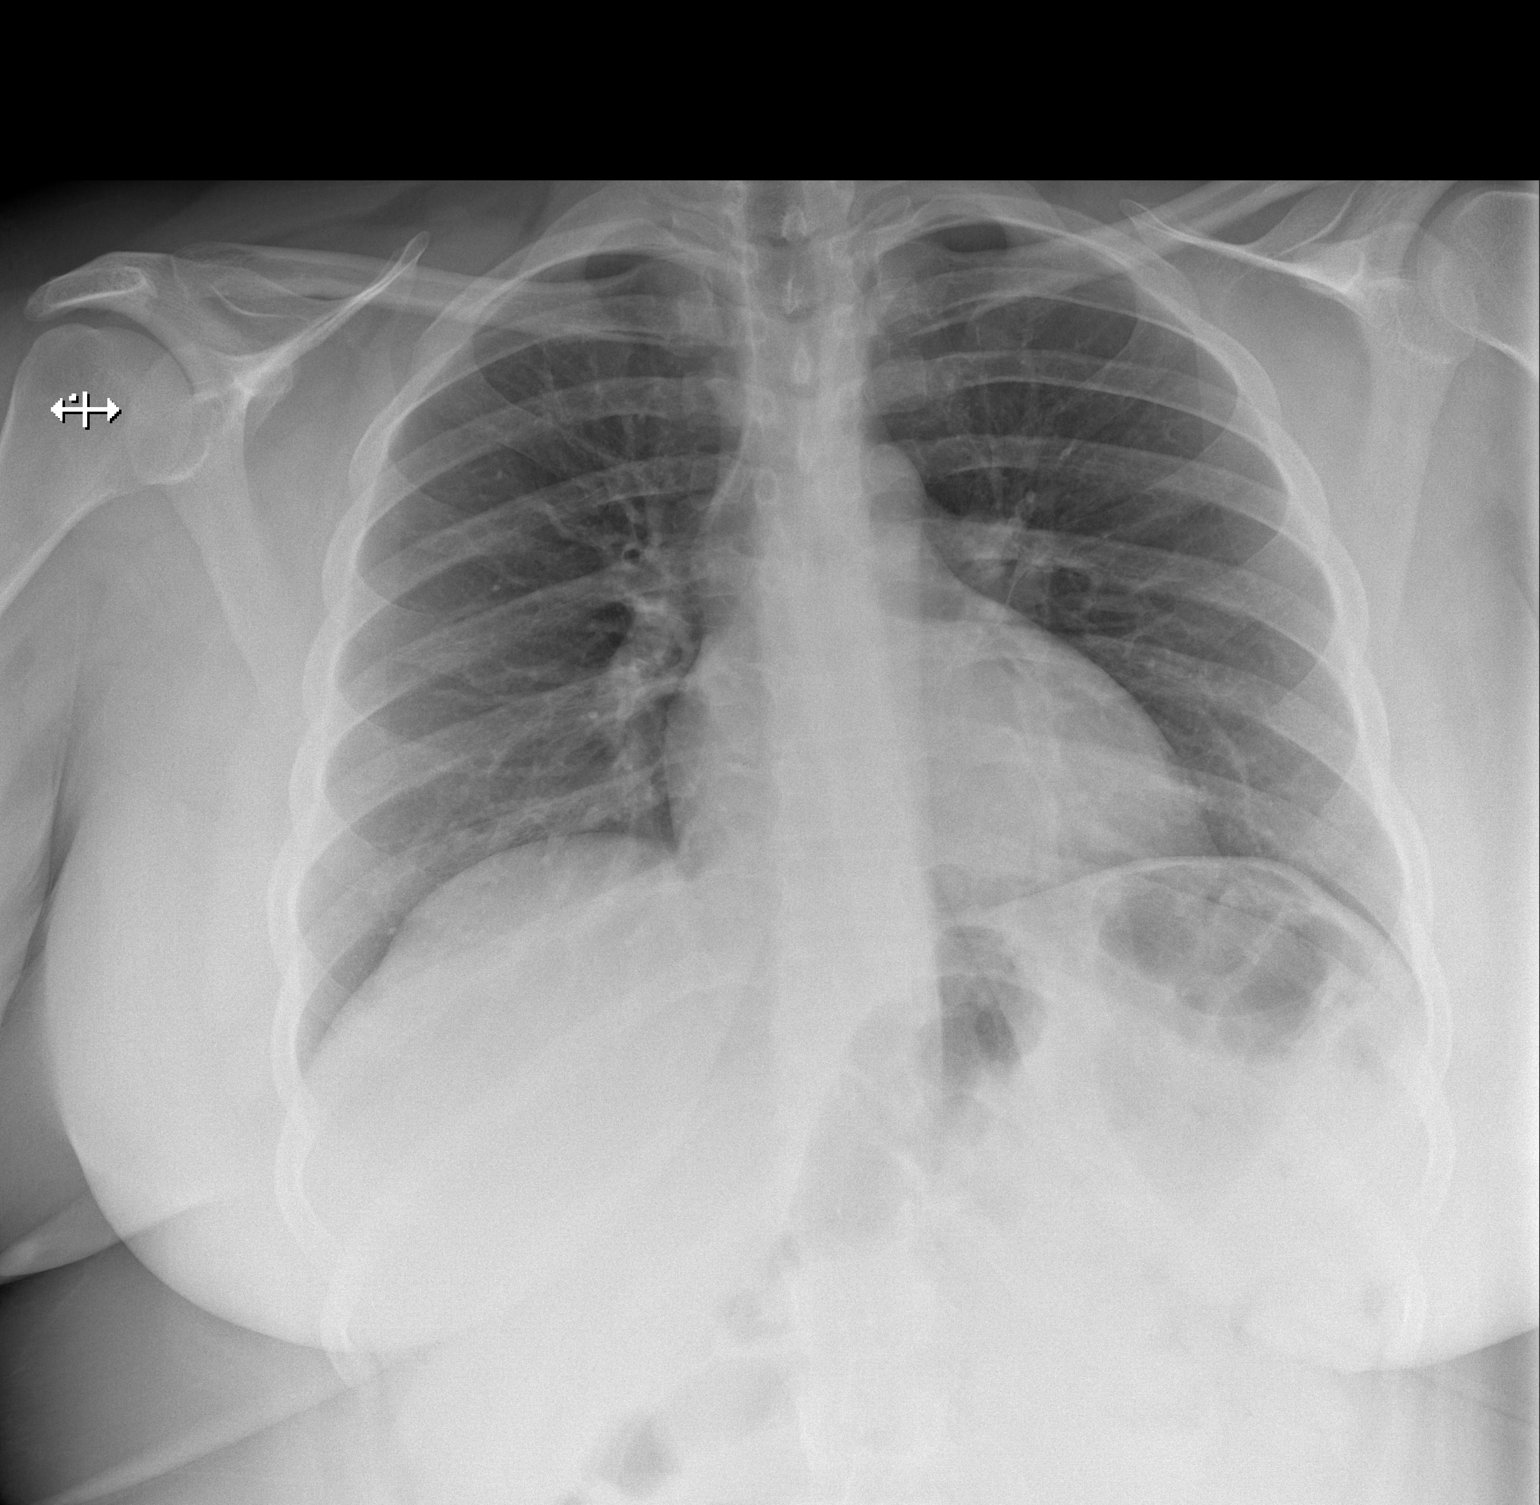

[w chest lat]
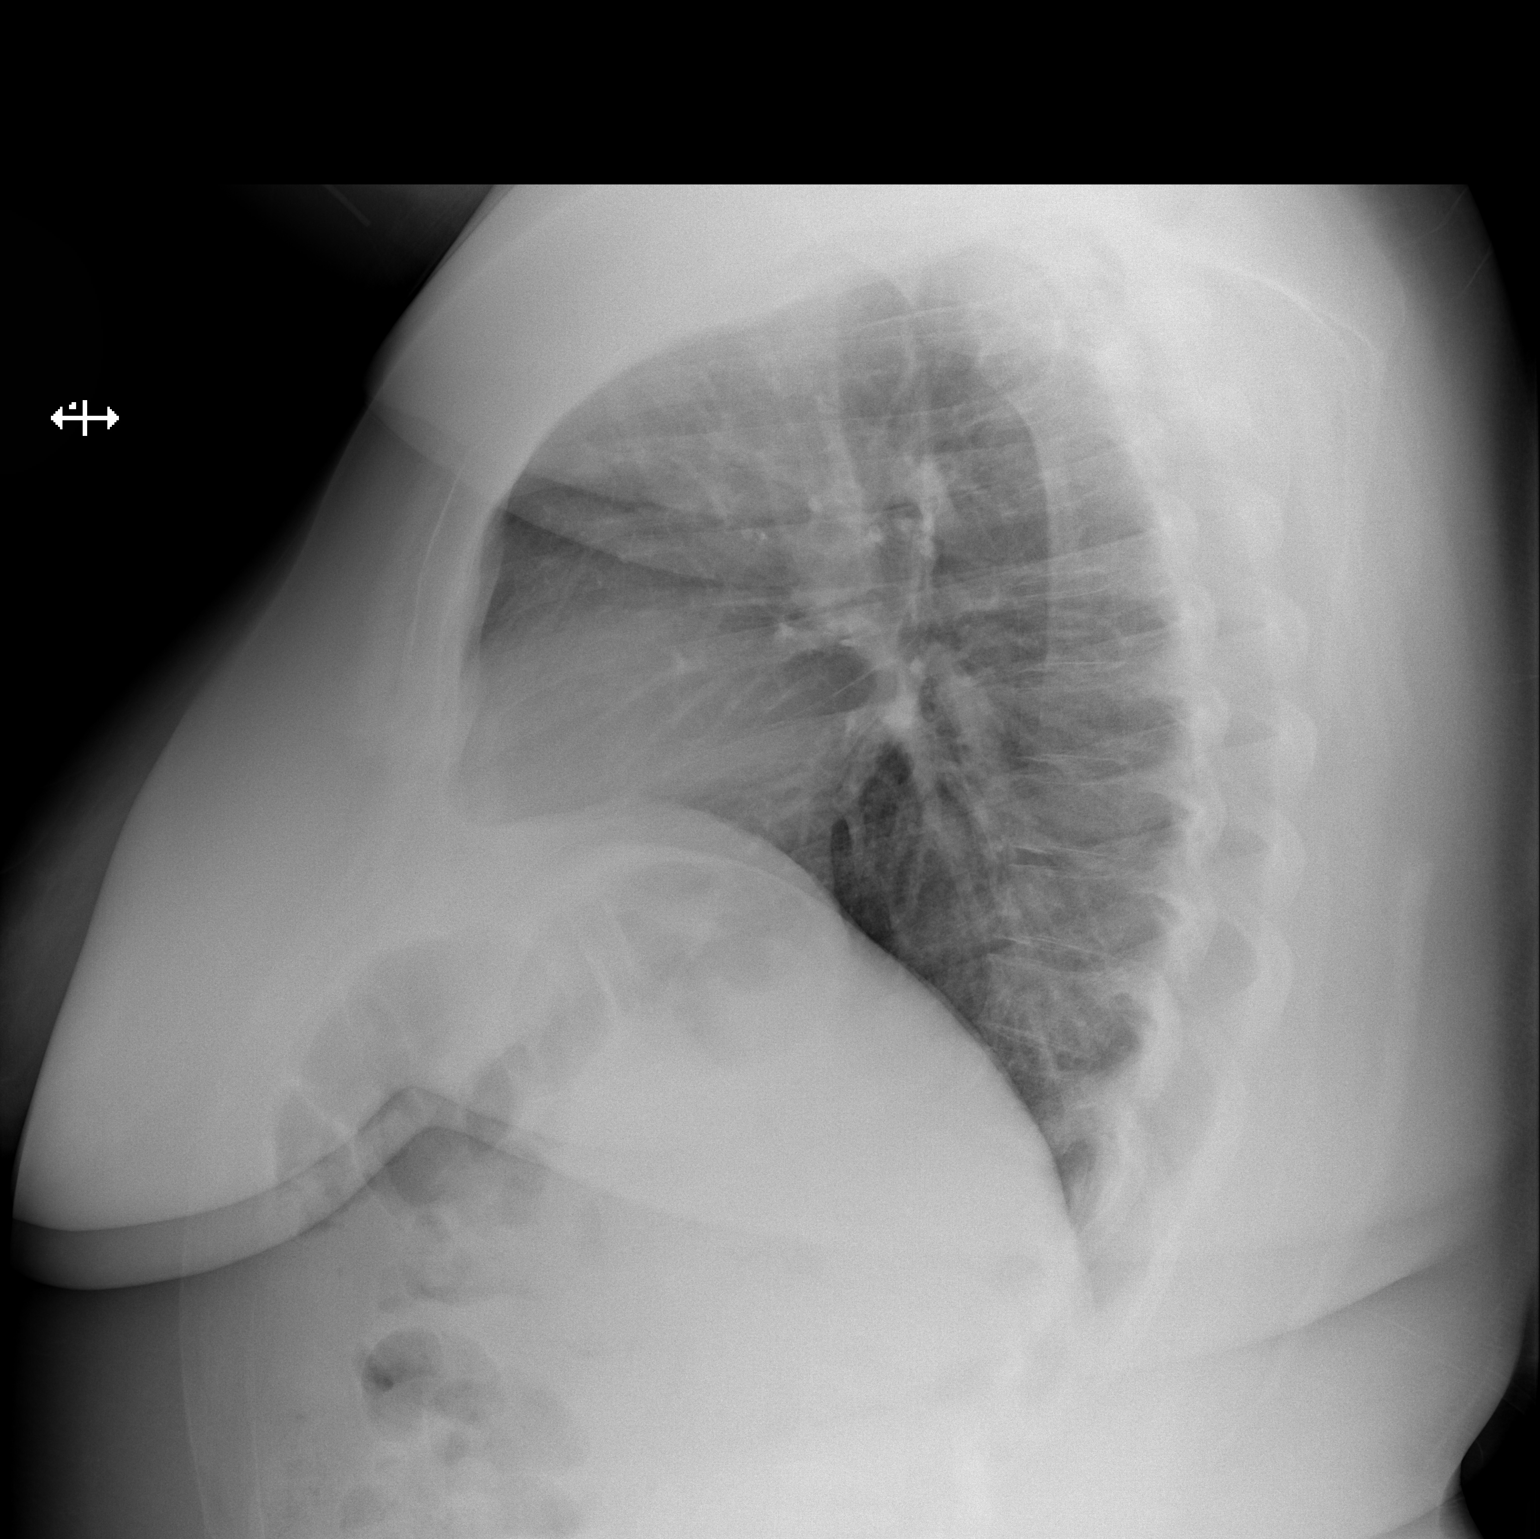

[2 of 2 positions shown; findings below may reference images not displayed]

FINDINGS: The heart size and mediastinal contours are within normal limits.
Both lungs are clear. The visualized skeletal structures are
unremarkable.
IMPRESSION: No active cardiopulmonary disease.

## 2020-11-26 ENCOUNTER — Other Ambulatory Visit: Payer: Self-pay

## 2020-11-26 ENCOUNTER — Encounter (HOSPITAL_BASED_OUTPATIENT_CLINIC_OR_DEPARTMENT_OTHER): Payer: Self-pay | Admitting: Obstetrics and Gynecology

## 2020-11-26 ENCOUNTER — Emergency Department (HOSPITAL_BASED_OUTPATIENT_CLINIC_OR_DEPARTMENT_OTHER)
Admission: EM | Admit: 2020-11-26 | Discharge: 2020-11-26 | Disposition: A | Discharge status: Home / Self Care | Payer: BLUE CROSS/BLUE SHIELD | Attending: Emergency Medicine | Admitting: Emergency Medicine

## 2020-11-26 DIAGNOSIS — K644 Residual hemorrhoidal skin tags: Secondary | ICD-10-CM

## 2020-11-26 DIAGNOSIS — I10 Essential (primary) hypertension: Secondary | ICD-10-CM | POA: Diagnosis not present

## 2020-11-26 HISTORY — DX: Unspecified glaucoma: H40.9

## 2020-11-26 HISTORY — DX: Polycystic ovarian syndrome: E28.2

## 2020-11-26 HISTORY — DX: Essential (primary) hypertension: I10

## 2020-11-26 MED ORDER — HYDROCORTISONE (PERIANAL) 2.5 % EX CREA: 1.0000 "application " | TOPICAL_CREAM | Freq: Two times a day (BID) | CUTANEOUS | 0 refills | Status: DC

## 2020-11-26 NOTE — ED Triage Notes (Signed)
Patient reports to the ER for a possible abscess in her fold. Patient reports she noticed it a few days ago after shaving. Patient reports pain with sitting. Reports she has been using tea tree oil and hemorrhoid cream to make it smaller

## 2020-11-26 NOTE — ED Provider Notes (Signed)
[facility] EMERGENCY DEPT Provider Note   CSN: [id number] Arrival date & time: 11/26/20  1834     History Chief Complaint  Patient presents with   Abscess    Anita [name] is a 35 y.o. female.  HPI 36 year old presents to the emergency department today for concern for possible abscess.  For the past 2 days she has noticed a bump that is enlarged to her rectum.  Patient reports no significant pain to the area.  No pain with bowel movements.  No fevers or chills.  No history of recurrent abscesses.  Patient has tried no medications for symptoms.  No alleviating or aggravating factors.    Past Medical History:  Diagnosis Date   Glaucoma    Hypertension    PCOS (polycystic ovarian syndrome)     There are no problems to display for this patient.   Past Surgical History:  Procedure Laterality Date   WISDOM TOOTH EXTRACTION Bilateral      OB History     Gravida  1   Para  1   Term  0   Preterm  1   AB  0   Living  1      SAB  0   IAB  0   Ectopic  0   Multiple  0   Live Births  1           No family history on file.  Social History   Tobacco Use   Smoking status: Never    Passive exposure: Never   Smokeless tobacco: Never  Vaping Use   Vaping Use: Never used  Substance Use Topics   Alcohol use: Never   Drug use: Never    Home Medications Prior to Admission medications   Medication Sig Start Date End Date Taking? Authorizing Provider  hydrocortisone (ANUSOL-HC) 2.5 % rectal cream Place 1 application rectally 2 (two) times daily. 11/26/20  Yes Rise Mu, PA-C    Allergies    Patient has no known allergies.  Review of Systems   Review of Systems  Constitutional:  Negative for chills and fever.  HENT:  Negative for congestion.   Eyes:  Negative for discharge.  Skin:  Positive for color change.  Psychiatric/Behavioral:  Negative for confusion.    Physical Exam Updated Vital Signs BP 122/84   Pulse  60   Temp 98.1 F (36.7 C)   Resp 18   Ht 5\' 7"  (1.702 m)   Wt 108.9 kg   LMP 11/21/2020 (Exact Date)   SpO2 100%   BMI 37.59 kg/m   Physical Exam Vitals and nursing note reviewed.  Constitutional:      General: She is not in acute distress.    Appearance: She is well-developed.  HENT:     Head: Normocephalic and atraumatic.  Eyes:     General: No scleral icterus.       Right eye: No discharge.        Left eye: No discharge.  Pulmonary:     Effort: No respiratory distress.  Genitourinary:    Comments: Patient has external nonthrombosed hemorrhoid at the spot of where she feels the lesion.  There is no fluctuance, erythema or warmth to suspect abscess.  There is no pain with palpation. Musculoskeletal:        General: Normal range of motion.     Cervical back: Normal range of motion.  Skin:    Coloration: Skin is not pale.  Neurological:  Mental Status: She is alert.  Psychiatric:        Behavior: Behavior normal.        Thought Content: Thought content normal.        Judgment: Judgment normal.    ED Results / Procedures / Treatments   Labs (all labs ordered are listed, but only abnormal results are displayed) Labs Reviewed - No data to display  EKG None  Radiology No results found.  Procedures Procedures   Medications Ordered in ED Medications - No data to display  ED Course  I have reviewed the triage vital signs and the nursing notes.  Pertinent labs & imaging results that were available during my care of the patient were reviewed by me and considered in my medical decision making (see chart for details).    MDM Rules/Calculators/A&P                           Patient presents for concern of possible abscess to her groin.  On exam patient pointing to what appears to be a external nonthrombosed hemorrhoid.  There is no evidence of abscess.  No indication for I&D at this time.  We will give Anusol and discussed sitz bath's at home.  Discussed  reasons to return to the ER encourage primary care follow-up. Final Clinical Impression(s) / ED Diagnoses Final diagnoses:  External hemorrhoid    Rx / DC Orders ED Discharge Orders          Ordered    hydrocortisone (ANUSOL-HC) 2.5 % rectal cream  2 times daily        11/26/20 2041             Wallace Keller 11/26/20 2119    Tegeler, Canary Brim, MD 11/26/20 2317

## 2020-11-26 NOTE — ED Notes (Signed)
Pt discharged to home. Discharge instructions have been discussed with patient and/or family members. Pt verbally acknowledges understanding d/c instructions, and endorses comprehension to checkout at registration before leaving.  °

## 2020-11-27 ENCOUNTER — Encounter (HOSPITAL_COMMUNITY): Payer: Self-pay | Admitting: Emergency Medicine

## 2020-12-20 ENCOUNTER — Other Ambulatory Visit: Payer: Self-pay

## 2020-12-20 ENCOUNTER — Ambulatory Visit (INDEPENDENT_AMBULATORY_CARE_PROVIDER_SITE_OTHER): Payer: BLUE CROSS/BLUE SHIELD | Admitting: Family Medicine

## 2020-12-20 VITALS — BP 114/78 | HR 76 | Ht 67.0 in | Wt 240.4 lb

## 2020-12-20 DIAGNOSIS — I10 Essential (primary) hypertension: Secondary | ICD-10-CM

## 2020-12-20 NOTE — Progress Notes (Signed)
New Patient Office Visit  Subjective:  Patient ID: Anita [name], female    DOB: 1985-08-15  Age: 35 y.o. MRN: [id number]  CC:  Chief Complaint  Patient presents with   Establish Care    Former PCP Dr. Maryelizabeth [name]. Patient is currently seeing Dr. Sherryll [name] at [facility] for OB/GYN care.    Hypertension    Patient presents today to ensure that he BP is managed well with current medication regiment. She states she went to a health fair for her job and was told her BP was elevated and to follow up with her PCP. Patient admits to actively working out to lose weight and help manage BP. She doesn't check BP regularly at home but states she feels like it is managed well.    PCOS    Patient has known PCOS. She does have an OB/GYN who is managing. She states her OB told her that her BP could be controlled with a low dose oral contraceptive but has not had any formal follow up regarding that treatment.    HPI Anita [name] is a 35 year old female presenting to establish in clinic.  She has pain concerns as outlined above.  Past medical history significant for PCOS, glaucoma, hypertension.  Hypertension: Reports being diagnosed with this by prior PCP and was started on hydrochlorothiazide and subsequently labetalol.  Reports that blood pressure has been controlled with current regimen.  However, she did have blood pressure checked at a health fair recently and was told it was slightly elevated at that time.  She checks blood pressure at home about once every other week.  These tend to be at goal.  PCOS, does follow with OB/GYN regarding this.  Does not have any concerns at present related to this.  Glaucoma: Follows with ophthalmology, no current concerns related to this.  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  Outside of work she describes herself as more of a homebody.  She does enjoy going to the gym, church,  listening to music.  She also enjoys going to the beach.  She is originally from [place].  Past Medical History:  Diagnosis Date   Family history of clotting disorder    father   Glaucoma    Glaucoma 2012   Anita [name], [facility]   Hemorrhoids    Hypertension    Insomnia 04/2014   associated with loss of significant other   Nexplanon in place 2015   Dr. Lynden [name], [facility]; irregular periods prior to Nexplanon   Obesity    PCOS (polycystic ovarian syndrome)    PCOS (polycystic ovarian syndrome) 2009   Urinary tract infection    Wears glasses     Past Surgical History:  Procedure Laterality Date   COLONOSCOPY  2009   normal, due to irregular stool, blood in stool.  [facility]   WISDOM TOOTH EXTRACTION Bilateral     Family History  Problem Relation Age of Onset   Diabetes Mother    Hypertension Mother    Hyperlipidemia Mother    Hyperlipidemia Father    Heart disease Father 40       mural thrombis   Deep vein thrombosis Father    Anita [name]    Hypertension Maternal [name]    Stroke Maternal [name]    COPD Maternal Grandfather        emphysema   Hypertension Paternal [name]  Cancer Maternal Aunt 49       colon   Cancer Maternal Uncle 12       pancreatic    Social History   Socioeconomic History   Marital status: Single    Spouse name: Not on file   Number of children: Not on file   Years of education: Not on file   Highest education level: Not on file  Occupational History   Not on file  Tobacco Use   Smoking status: Not on file    Passive exposure: Never   Smokeless tobacco: Never  Vaping Use   Vaping Use: Never used  Substance and Sexual Activity   Alcohol use: Yes    Alcohol/week: 0.0 standard drinks    Comment: occasional   Drug use: No   Sexual activity: Yes  Other Topics Concern   Not on file  Social History Narrative   ** Merged History Encounter **       ** Merged History  Encounter **       Lives with son, Anita [name], exercise - not much, 08/2016.  Plans to start walking.  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   Social Determinants of Health   Financial Resource Strain: Not on file  Food Insecurity: Not on file  Transportation Needs: Not on file  Physical Activity: Not on file  Stress: Not on file  Social Connections: Not on file  Intimate Partner Violence: Not on file    Objective:   Today's Vitals: LMP 11/21/2020 (Exact Date)   Physical Exam  35 year old female in no acute distress Cardiovascular exam with regular rate and rhythm, no murmurs appreciated Lungs clear to auscultation bilaterally  Assessment & Plan:   Problem List Items Addressed This Visit       Cardiovascular and Mediastinum   Primary hypertension - Primary    Blood pressure at goal in office today Current medication regimen is somewhat atypical, however given that blood pressure is at goal presently, will continue with current regimen and monitor blood pressure moving forward Recommend intermittent blood pressure check at home Recommend Anita [name] diet Recommend regular weekly aerobic exercise We will check labs as below      Relevant Orders   CBC with Differential/Platelet   Comprehensive metabolic panel   Lipid panel   Hemoglobin A1c   TSH Rfx on Abnormal to Free T4    Outpatient Encounter Medications as of 12/20/2020  Medication Sig   etonogestrel (NEXPLANON) 68 MG IMPL implant 1 each by Subdermal route once.   hydrocortisone (ANUSOL-HC) 2.5 % rectal cream Place 1 application rectally 2 (two) times daily.   linaclotide (LINZESS) 145 MCG CAPS capsule Take 1 capsule (145 mcg total) by mouth daily before breakfast.   promethazine-dextromethorphan (PROMETHAZINE-DM) 6.25-15 MG/5ML syrup Take 5 mLs by mouth 4 (four) times daily as needed for cough.   sodium chloride (OCEAN) 0.65 % SOLN nasal spray Place 1 spray into both nostrils as needed for congestion.   Travoprost, BAK Free,  (TRAVATAN) 0.004 % SOLN ophthalmic solution Place 1 drop into both eyes at bedtime.    No facility-administered encounter medications on file as of 12/20/2020.    Follow-up: Return in about 6 months (around 06/20/2021) for Follow Up.  Plan for follow-up in about 6 months to complete CPE  Shadie Sweatman J De Peru, MD

## 2020-12-20 NOTE — Assessment & Plan Note (Signed)
Blood pressure at goal in office today Current medication regimen is somewhat atypical, however given that blood pressure is at goal presently, will continue with current regimen and monitor blood pressure moving forward Recommend intermittent blood pressure check at home Recommend DASH diet Recommend regular weekly aerobic exercise We will check labs as below

## 2020-12-20 NOTE — Patient Instructions (Signed)
  Medication Instructions:  Your physician recommends that you continue on your current medications as directed. Please refer to the Current Medication list given to you today. --If you need a refill on any your medications before your next appointment, please call your pharmacy first. If no refills are authorized on file call the office.-- Lab Work: Your physician has recommended that you have lab work today: CBC, CMP, Lipid, TSH, and A1C If you have labs (blood work) drawn today and your tests are completely normal, you will receive your results via MyChart message OR a phone call from our staff.  Please ensure you check your voicemail in the event that you authorized detailed messages to be left on a delegated number. If you have any lab test that is abnormal or we need to change your treatment, we will call you to review the results.   Follow-Up: Your next appointment:   Your physician recommends that you schedule a follow-up appointment in: 6 MONTHS for CPE with Dr. de Peru  You will receive a text message or e-mail with a link to a survey about your care and experience with [place] today! We would greatly appreciate your feedback!   Thanks for letting us be apart of your health journey!!  Primary Care and Sports Medicine   Dr. Ceasar Mons Peru   We encourage you to activate your patient portal called "MyChart".  Sign up information is provided on this After Visit Summary.  MyChart is used to connect with patients for Virtual Visits (Telemedicine).  Patients are able to view lab/test results, encounter notes, upcoming appointments, etc.  Non-urgent messages can be sent to your provider as well. To learn more about what you can do with MyChart, please visit --  [email or web address].

## 2020-12-21 ENCOUNTER — Telehealth (HOSPITAL_BASED_OUTPATIENT_CLINIC_OR_DEPARTMENT_OTHER): Payer: Self-pay

## 2020-12-21 DIAGNOSIS — R718 Other abnormality of red blood cells: Secondary | ICD-10-CM

## 2020-12-21 LAB — CBC WITH DIFFERENTIAL/PLATELET
Basophils Absolute: 0.1 10*3/uL (ref 0.0–0.2)
Basos: 1 %
EOS (ABSOLUTE): 0.1 10*3/uL (ref 0.0–0.4)
Eos: 1 %
Hematocrit: 38.9 % (ref 34.0–46.6)
Hemoglobin: 12.4 g/dL (ref 11.1–15.9)
Immature Grans (Abs): 0 10*3/uL (ref 0.0–0.1)
Immature Granulocytes: 0 %
Lymphocytes Absolute: 1.6 10*3/uL (ref 0.7–3.1)
Lymphs: 23 %
MCH: 22.5 pg — ABNORMAL LOW (ref 26.6–33.0)
MCHC: 31.9 g/dL (ref 31.5–35.7)
MCV: 71 fL — ABNORMAL LOW (ref 79–97)
Monocytes Absolute: 0.7 10*3/uL (ref 0.1–0.9)
Monocytes: 10 %
Neutrophils Absolute: 4.5 10*3/uL (ref 1.4–7.0)
Neutrophils: 65 %
Platelets: 329 10*3/uL (ref 150–450)
RBC: 5.5 x10E6/uL — ABNORMAL HIGH (ref 3.77–5.28)
RDW: 15.1 % (ref 11.7–15.4)
WBC: 7 10*3/uL (ref 3.4–10.8)

## 2020-12-21 LAB — LIPID PANEL
Chol/HDL Ratio: 3.6 ratio (ref 0.0–4.4)
Cholesterol, Total: 178 mg/dL (ref 100–199)
HDL: 50 mg/dL (ref >39)
LDL Chol Calc (NIH): 117 mg/dL — ABNORMAL HIGH (ref 0–99)
Triglycerides: 57 mg/dL (ref 0–149)
VLDL Cholesterol Cal: 11 mg/dL (ref 5–40)

## 2020-12-21 LAB — HEMOGLOBIN A1C
Est. average glucose Bld gHb Est-mCnc: 111 mg/dL
Hgb A1c MFr Bld: 5.5 % (ref 4.8–5.6)

## 2020-12-21 LAB — COMPREHENSIVE METABOLIC PANEL
ALT: 8 IU/L (ref 0–32)
AST: 13 IU/L (ref 0–40)
Albumin/Globulin Ratio: 1.3 (ref 1.2–2.2)
Albumin: 4.5 g/dL (ref 3.8–4.8)
Alkaline Phosphatase: 94 IU/L (ref 44–121)
BUN/Creatinine Ratio: 16 (ref 9–23)
BUN: 13 mg/dL (ref 6–20)
Bilirubin Total: 0.2 mg/dL (ref 0.0–1.2)
CO2: 27 mmol/L (ref 20–29)
Calcium: 9.6 mg/dL (ref 8.7–10.2)
Chloride: 100 mmol/L (ref 96–106)
Creatinine, Ser: 0.82 mg/dL (ref 0.57–1.00)
Globulin, Total: 3.4 g/dL (ref 1.5–4.5)
Glucose: 86 mg/dL (ref 70–99)
Potassium: 4.5 mmol/L (ref 3.5–5.2)
Sodium: 139 mmol/L (ref 134–144)
Total Protein: 7.9 g/dL (ref 6.0–8.5)
eGFR: 94 mL/min/{1.73_m2} (ref >59)

## 2020-12-21 LAB — TSH RFX ON ABNORMAL TO FREE T4: TSH: 0.546 u[IU]/mL (ref 0.450–4.500)

## 2020-12-21 NOTE — Telephone Encounter (Signed)
-----   Message from Hosie Poisson Peru, MD sent at 12/21/2020  8:39 AM EST ----- White blood cell count is normal red blood cell count is slightly elevated, however is stable.  Electrolytes, kidney function and liver function are all normal.  Lipid panel with normal total cholesterol and elevated "bad" cholesterol.  Would initially focus on lifestyle modifications to address cholesterol issues - including dietary changes such as incorporating fresh fruits and vegetables, lean protein in the diet and reducing consumption of red meats, saturated fats, processed foods.  Recommend gradually increasing level of activity with eventual goal of about 150 minutes/week of moderate intensity aerobic exercise.  Hemoglobin A1c is within normal range.  Thyroid function is normal. Given her slightly elevated red blood cell count and low MCV, will need to complete iron studies - if we can please have these labs added on.

## 2020-12-23 LAB — IRON AND TIBC
Iron Saturation: 9 % — CL (ref 15–55)
Iron: 30 ug/dL (ref 27–159)
Total Iron Binding Capacity: 348 ug/dL (ref 250–450)
UIBC: 318 ug/dL (ref 131–425)

## 2020-12-23 LAB — FERRITIN: Ferritin: 38 ng/mL (ref 15–150)

## 2020-12-23 LAB — SPECIMEN STATUS REPORT

## 2021-03-19 ENCOUNTER — Encounter (HOSPITAL_BASED_OUTPATIENT_CLINIC_OR_DEPARTMENT_OTHER): Payer: Self-pay | Admitting: Family Medicine

## 2021-03-19 ENCOUNTER — Other Ambulatory Visit (HOSPITAL_BASED_OUTPATIENT_CLINIC_OR_DEPARTMENT_OTHER): Payer: Self-pay | Admitting: Family Medicine

## 2021-03-19 ENCOUNTER — Telehealth (HOSPITAL_BASED_OUTPATIENT_CLINIC_OR_DEPARTMENT_OTHER): Payer: Self-pay | Admitting: Family Medicine

## 2021-03-19 DIAGNOSIS — I10 Essential (primary) hypertension: Secondary | ICD-10-CM

## 2021-03-19 NOTE — Telephone Encounter (Signed)
Pt called stated she is needing a refill on labetalol. This was discussed on her NP appt on 12/20/20. Pt stated she will run out on Thursday 03/21/21 ?Please advise. ?

## 2021-03-21 ENCOUNTER — Other Ambulatory Visit (HOSPITAL_BASED_OUTPATIENT_CLINIC_OR_DEPARTMENT_OTHER): Payer: Self-pay

## 2021-03-21 ENCOUNTER — Other Ambulatory Visit (HOSPITAL_BASED_OUTPATIENT_CLINIC_OR_DEPARTMENT_OTHER): Payer: Self-pay | Admitting: Family Medicine

## 2021-03-21 DIAGNOSIS — I10 Essential (primary) hypertension: Secondary | ICD-10-CM

## 2021-03-21 MED ORDER — LABETALOL HCL 200 MG PO TABS: 200.0000 mg | ORAL_TABLET | Freq: Two times a day (BID) | ORAL | 0 refills | Status: DC

## 2021-04-03 ENCOUNTER — Telehealth (HOSPITAL_BASED_OUTPATIENT_CLINIC_OR_DEPARTMENT_OTHER): Payer: Self-pay | Admitting: Family Medicine

## 2021-04-03 NOTE — Telephone Encounter (Signed)
Error

## 2021-06-14 ENCOUNTER — Other Ambulatory Visit (HOSPITAL_BASED_OUTPATIENT_CLINIC_OR_DEPARTMENT_OTHER): Payer: Self-pay | Admitting: Family Medicine

## 2021-06-14 DIAGNOSIS — I10 Essential (primary) hypertension: Secondary | ICD-10-CM

## 2021-06-20 ENCOUNTER — Encounter (HOSPITAL_BASED_OUTPATIENT_CLINIC_OR_DEPARTMENT_OTHER): Payer: Commercial Managed Care - PPO | Admitting: Family Medicine

## 2021-07-30 ENCOUNTER — Encounter (HOSPITAL_BASED_OUTPATIENT_CLINIC_OR_DEPARTMENT_OTHER): Payer: Self-pay | Admitting: Family Medicine

## 2021-07-30 ENCOUNTER — Ambulatory Visit (INDEPENDENT_AMBULATORY_CARE_PROVIDER_SITE_OTHER): Payer: Commercial Managed Care - PPO | Admitting: Family Medicine

## 2021-07-30 VITALS — BP 102/74 | HR 98 | Temp 97.8°F | Ht 67.0 in | Wt 251.3 lb

## 2021-07-30 DIAGNOSIS — Z0000 Encounter for general adult medical examination without abnormal findings: Secondary | ICD-10-CM | POA: Diagnosis not present

## 2021-07-30 DIAGNOSIS — Z600 Problems of adjustment to life-cycle transitions: Secondary | ICD-10-CM

## 2021-07-30 NOTE — Assessment & Plan Note (Signed)
Routine HCM labs ordered. HCM reviewed/discussed. Anticipatory guidance regarding healthy weight, lifestyle and choices given. Recommend healthy diet.  Recommend approximately 150 minutes/week of moderate intensity exercise Recommend regular dental and vision exams Always use seatbelt/lap and shoulder restraints Recommend using smoke alarms and checking batteries at least twice a year Recommend using sunscreen when outside Discussed tetanus immunization recommendations, patient agreed to proceed with this today 

## 2021-07-30 NOTE — Assessment & Plan Note (Signed)
Patient reports that she has been having some depressive symptoms.  Primarily correlates this to some family stressors generalized, particular related to her son who is 38 years old.  She denies any history of depression anxiety, does have some family history of depression however.  She denies any SI or HI.  Indicates that her social support network is somewhat limited, does have some people who she is able to talk with.  Discussed options with patient today and she would be interested in referral to establish with psychologist/counselor/therapist.  She would prefer to avoid medication if possible Referral to psychology placed today Plan for follow-up visit in 1 to 2 months to monitor progress, assess response to therapy

## 2021-07-30 NOTE — Progress Notes (Signed)
Subjective:    CC: Annual Physical Exam  HPI:  Anita [name] is a 36 y.o. presenting for annual physical  I reviewed the past medical history, family history, social history, surgical history, and allergies today and no changes were needed.  Please see the problem list section below in epic for further details.  Past Medical History: Past Medical History:  Diagnosis Date   Family history of clotting disorder    father   Glaucoma    Glaucoma 2012   Dr. Velna Ochs, [facility]   Hemorrhoids    Hypertension    Insomnia 04/2014   associated with loss of significant other   Nexplanon in place 2015   Dr. Lynden Ang, [facility]; irregular periods prior to Nexplanon   Obesity    PCOS (polycystic ovarian syndrome)    PCOS (polycystic ovarian syndrome) 2009   Urinary tract infection    Wears glasses    Past Surgical History: Past Surgical History:  Procedure Laterality Date   COLONOSCOPY  2009   normal, due to irregular stool, blood in stool.  [facility]   WISDOM TOOTH EXTRACTION Bilateral    Social History: Social History   Socioeconomic History   Marital status: Single    Spouse name: Not on file   Number of children: Not on file   Years of education: Not on file   Highest education level: Not on file  Occupational History   Not on file  Tobacco Use   Smoking status: Not on file    Passive exposure: Never   Smokeless tobacco: Never  Vaping Use   Vaping Use: Never used  Substance and Sexual Activity   Alcohol use: Yes    Alcohol/week: 0.0 standard drinks of alcohol    Comment: occasional   Drug use: No   Sexual activity: Yes  Other Topics Concern   Not on file  Social History Narrative   ** Merged History Encounter **       ** Merged History Encounter **       Lives with son, [name], exercise - not much, 08/2016.  Plans to start walking.  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   Social Determinants of Health   Financial Resource Strain: Not on file  Food  Insecurity: Not on file  Transportation Needs: Not on file  Physical Activity: Not on file  Stress: Not on file  Social Connections: Not on file   Family History: Family History  Problem Relation Age of Onset   Diabetes Mother    Hypertension Mother    Hyperlipidemia Mother    Hyperlipidemia Father    Heart disease Father 40       mural thrombis   Deep vein thrombosis Father    Dementia Maternal Grandmother    Hypertension Maternal Grandmother    Stroke Maternal Grandmother    COPD Maternal Grandfather        emphysema   Hypertension Paternal Grandmother    Cancer Maternal Aunt 58       colon   Cancer Maternal Uncle 60       pancreatic   Allergies: No Known Allergies Medications: See med rec.  Review of Systems: No headache, visual changes, nausea, vomiting, diarrhea, constipation, dizziness, abdominal pain, skin rash, fevers, chills, night sweats, swollen lymph nodes, weight loss, chest pain, body aches, joint swelling, muscle aches, shortness of breath, mood changes, visual or auditory hallucinations.  Objective:    BP 102/74   Pulse 98   Temp 97.8 F (  36.6 C) (Oral)   Ht 5\' 7"  (1.702 m)   Wt 251 lb 4.8 oz (114 kg)   SpO2 98%   BMI 39.36 kg/m   General: Well Developed, well nourished, and in no acute distress.  Neuro: Alert and oriented x3, extra-ocular muscles intact, sensation grossly intact. Cranial nerves II through XII are intact, motor, sensory, and coordinative functions are all intact. HEENT: Normocephalic, atraumatic, pupils equal round reactive to light, neck supple, no masses, no lymphadenopathy, thyroid nonpalpable. Oropharynx, nasopharynx, external ear canals are unremarkable. Skin: Warm and dry, no rashes noted.  Cardiac: Regular rate and rhythm, no murmurs rubs or gallops.  Respiratory: Clear to auscultation bilaterally. Not using accessory muscles, speaking in full sentences.  Abdominal: Soft, nontender, nondistended, positive bowel sounds, no  masses, no organomegaly.  Musculoskeletal: Shoulder, elbow, wrist, hip, knee, ankle stable, and with full range of motion.  Impression and Recommendations:    Wellness examination Routine HCM labs ordered. HCM reviewed/discussed. Anticipatory guidance regarding healthy weight, lifestyle and choices given. Recommend healthy diet.  Recommend approximately 150 minutes/week of moderate intensity exercise Recommend regular dental and vision exams Always use seatbelt/lap and shoulder restraints Recommend using smoke alarms and checking batteries at least twice a year Recommend using sunscreen when outside Discussed tetanus immunization recommendations, patient agreed to proceed with this today  Phase of life problem Patient reports that she has been having some depressive symptoms.  Primarily correlates this to some family stressors generalized, particular related to her son who is 86 years old.  She denies any history of depression anxiety, does have some family history of depression however.  She denies any SI or HI.  Indicates that her social support network is somewhat limited, does have some people who she is able to talk with.  Discussed options with patient today and she would be interested in referral to establish with psychologist/counselor/therapist.  She would prefer to avoid medication if possible Referral to psychology placed today Plan for follow-up visit in 1 to 2 months to monitor progress, assess response to therapy  Return in about 6 weeks (around 09/10/2021) for Mood.   ___________________________________________ Reyah Streeter de 09/12/2021, MD, ABFM, CAQSM Primary Care and Sports Medicine [facility]

## 2021-08-02 ENCOUNTER — Ambulatory Visit (HOSPITAL_BASED_OUTPATIENT_CLINIC_OR_DEPARTMENT_OTHER): Payer: Commercial Managed Care - PPO

## 2021-08-02 DIAGNOSIS — Z0000 Encounter for general adult medical examination without abnormal findings: Secondary | ICD-10-CM

## 2021-08-03 LAB — CBC WITH DIFFERENTIAL/PLATELET
Basophils Absolute: 0 10*3/uL (ref 0.0–0.2)
Basos: 0 %
EOS (ABSOLUTE): 0.1 10*3/uL (ref 0.0–0.4)
Eos: 1 %
Hematocrit: 38.6 % (ref 34.0–46.6)
Hemoglobin: 12.3 g/dL (ref 11.1–15.9)
Immature Grans (Abs): 0 10*3/uL (ref 0.0–0.1)
Immature Granulocytes: 0 %
Lymphocytes Absolute: 1.4 10*3/uL (ref 0.7–3.1)
Lymphs: 28 %
MCH: 22.7 pg — ABNORMAL LOW (ref 26.6–33.0)
MCHC: 31.9 g/dL (ref 31.5–35.7)
MCV: 71 fL — ABNORMAL LOW (ref 79–97)
Monocytes Absolute: 0.4 10*3/uL (ref 0.1–0.9)
Monocytes: 9 %
Neutrophils Absolute: 3 10*3/uL (ref 1.4–7.0)
Neutrophils: 62 %
Platelets: 292 10*3/uL (ref 150–450)
RBC: 5.42 x10E6/uL — ABNORMAL HIGH (ref 3.77–5.28)
RDW: 15 % (ref 11.7–15.4)
WBC: 4.9 10*3/uL (ref 3.4–10.8)

## 2021-08-03 LAB — COMPREHENSIVE METABOLIC PANEL
ALT: 8 IU/L (ref 0–32)
AST: 13 IU/L (ref 0–40)
Albumin/Globulin Ratio: 1.2 (ref 1.2–2.2)
Albumin: 4.1 g/dL (ref 3.9–4.9)
Alkaline Phosphatase: 85 IU/L (ref 44–121)
BUN/Creatinine Ratio: 19 (ref 9–23)
BUN: 14 mg/dL (ref 6–20)
Bilirubin Total: 0.3 mg/dL (ref 0.0–1.2)
CO2: 23 mmol/L (ref 20–29)
Calcium: 9.4 mg/dL (ref 8.7–10.2)
Chloride: 102 mmol/L (ref 96–106)
Creatinine, Ser: 0.74 mg/dL (ref 0.57–1.00)
Globulin, Total: 3.3 g/dL (ref 1.5–4.5)
Glucose: 93 mg/dL (ref 70–99)
Potassium: 3.7 mmol/L (ref 3.5–5.2)
Sodium: 140 mmol/L (ref 134–144)
Total Protein: 7.4 g/dL (ref 6.0–8.5)
eGFR: 106 mL/min/{1.73_m2} (ref >59)

## 2021-08-03 LAB — LIPID PANEL
Chol/HDL Ratio: 3.7 ratio (ref 0.0–4.4)
Cholesterol, Total: 177 mg/dL (ref 100–199)
HDL: 48 mg/dL (ref >39)
LDL Chol Calc (NIH): 117 mg/dL — ABNORMAL HIGH (ref 0–99)
Triglycerides: 65 mg/dL (ref 0–149)
VLDL Cholesterol Cal: 12 mg/dL (ref 5–40)

## 2021-08-03 LAB — HEMOGLOBIN A1C
Est. average glucose Bld gHb Est-mCnc: 117 mg/dL
Hgb A1c MFr Bld: 5.7 % — ABNORMAL HIGH (ref 4.8–5.6)

## 2021-08-03 LAB — TSH RFX ON ABNORMAL TO FREE T4: TSH: 0.981 u[IU]/mL (ref 0.450–4.500)

## 2021-08-26 ENCOUNTER — Ambulatory Visit (INDEPENDENT_AMBULATORY_CARE_PROVIDER_SITE_OTHER): Payer: Commercial Managed Care - PPO | Admitting: Behavioral Health

## 2021-08-26 DIAGNOSIS — F4323 Adjustment disorder with mixed anxiety and depressed mood: Secondary | ICD-10-CM

## 2021-08-26 NOTE — Progress Notes (Signed)
                Nashawn Hillock L Vienna Folden, LMFT 

## 2021-08-26 NOTE — Progress Notes (Addendum)
[facility] Counselor Initial Adult Exam  Name: Anita [name] Date: 08/26/2021 MRN: [id number] DOB: 03-30-1985 PCP: de Peru, Raymond J, MD  Time spent: 60 min NuPt Intake via telephone; Pt @ home in private  & Clinician @ [facility] Office on telephone.  Guardian/Payee:  Self    Paperwork requested: No   Reason for Visit /Presenting Problem: elevated anx/dep & grief rxn due to extended grieving & delayed rxn to this OD on the part of her Partner & Son's Dad  Mental Status Exam: Appearance:   unk      Behavior:  Appropriate  Motor:  unk  Speech/Language:   Normal Rate  Affect:  Congruent  Mood:  depressed  Thought process:  normal  Thought content:    WNL  Sensory/Perceptual disturbances:    WNL  Orientation:  oriented to person, place, and time/date  Attention:  Good  Concentration:  Good  Memory:  WNL  Fund of knowledge:   Good  Insight:    Good  Judgment:   Good  Impulse Control:  Good   Appearance: NA    Behavior: Appropriate Motor:  NA Speech/Language: Normal Rate Affect: Appropriate Mood: anxious and tearful Thought process: normal Thought content: WNL Sensory/Perceptual disturbances: WNL Orientation: oriented to person, place, and time/date Attention: Good Concentration: Good Memory: WNL Fund of knowledge: Good Insight: Good Judgment: Good Impulse Control: Good  Reported Symptoms:  elevated anx/dep & recent adjustment issues to changes in her life in the past 2-3 yrs  Risk Assessment: Danger to Self:  No Self-injurious Behavior: No Danger to Others: No Duty to Warn:no Physical Aggression / Violence:No  Access to Firearms a concern: No  Gang Involvement:No  Patient / guardian was educated about steps to take if suicide or homicide risk level increases between visits: no While future psychiatric events cannot be accurately predicted, the patient does not currently require acute inpatient psychiatric care and does not currently meet  [facility] involuntary commitment criteria.  Substance Abuse History: Current substance abuse: No     Past Psychiatric History:   No previous psychological problems have been observed Outpatient Providers: Dr. Raymond de Peru, MD History of Psych Hospitalization: No  Psychological Testing:  NA    Abuse History:  Victim of: No.,  NA    Report needed: No. Victim of Neglect:No. Perpetrator of  NA   Witness / Exposure to Domestic Violence: No   Protective Services Involvement: No  Witness to [employer] Violence:  No   Family History:  Family History  Problem Relation Age of Onset   Diabetes Mother    Hypertension Mother    Hyperlipidemia Mother    Hyperlipidemia Father    Heart disease Father 40       mural thrombis   Deep vein thrombosis Father    Dementia Maternal Grandmother    Hypertension Maternal Grandmother    Stroke Maternal Grandmother    COPD Maternal Grandfather        emphysema   Hypertension Paternal Grandmother    Cancer Maternal Aunt 43       colon   Cancer Maternal Uncle 60       pancreatic    Living situation: the patient lives with their son  Sexual Orientation: Straight  Relationship Status: single  Name of spouse / other:Andrew lives & works in [place] If a parent, number of children / ages:13yo Son Anette Riedel  Support Systems: significant other friends parents  Surveyor, quantity Stress:   Working long hours  to make ends meet for their Family  Income/Employment/Disability: Employment  Financial planner: No   Educational History: Education: some college  Religion/Sprituality/World View: Not noted today  Any cultural differences that may affect / interfere with treatment:  not applicable   Recreation/Hobbies: not noted today  Stressors: Copy difficulties   Loss of Mother & Son's Fr 8 yrs ago    Strengths: Supportive Relationships, Family, Friends, and Able to Communicate Effectively  Barriers:  High anx levels as a  Single Parent handling the burden of finances & a job that has high demand for hours   Legal History: Pending legal issue / charges: The patient has no significant history of legal issues. History of legal issue / charges:  NA  Medical History/Surgical History: reviewed Past Medical History:  Diagnosis Date   Family history of clotting disorder    father   Glaucoma    Glaucoma 2012   Dr. Velna Ochs, [facility]   Hemorrhoids    Hypertension    Insomnia 04/2014   associated with loss of significant other   Nexplanon in place 2015   Dr. Lynden Ang, [facility]; irregular periods prior to Nexplanon   Obesity    PCOS (polycystic ovarian syndrome)    PCOS (polycystic ovarian syndrome) 2009   Urinary tract infection    Wears glasses     Past Surgical History:  Procedure Laterality Date   COLONOSCOPY  2009   normal, due to irregular stool, blood in stool.  [facility]   WISDOM TOOTH EXTRACTION Bilateral     Medications: Current Outpatient Medications  Medication Sig Dispense Refill   hydrochlorothiazide (HYDRODIURIL) 12.5 MG tablet TAKE 1 TABLET BY MOUTH EVERY DAY IN THE MORNING 90 tablet 1   labetalol (NORMODYNE) 200 MG tablet TAKE 1 TABLET BY MOUTH TWICE A DAY 180 tablet 1   No current facility-administered medications for this visit.    No Known Allergies  Diagnoses:  Adjustment d/o with mixed anx/dep  Plan of Care: ST-inc adjustment to recent life changes, losses, & death of several Family members LT-dec anx/dep & inc adjustment to recent move here to [place]   Deneise Lever, LMFT

## 2021-09-09 ENCOUNTER — Ambulatory Visit (INDEPENDENT_AMBULATORY_CARE_PROVIDER_SITE_OTHER): Payer: Commercial Managed Care - PPO | Admitting: Behavioral Health

## 2021-09-09 DIAGNOSIS — F4323 Adjustment disorder with mixed anxiety and depressed mood: Secondary | ICD-10-CM | POA: Diagnosis not present

## 2021-09-09 NOTE — Progress Notes (Addendum)
[facility] Counselor/Therapist Progress Note  Patient ID: Anita [name], MRN: [id number],    Date: 09/09/2021  Time Spent: 45 min Caregility video visit; Pt is home in private & Provider is @ [facility] on Caregility video  Treatment Type: Individual Therapy  Reported Symptoms: elevated anx/dep due to the first day of Sch & the lack of a bus for her Son in the morning  Mental Status Exam: Appearance:  Neat     Behavior: Appropriate  Motor: Normal  Speech/Language:  Normal Rate  Affect: Appropriate  Mood: anxious  Thought process: normal  Thought content:   WNL  Sensory/Perceptual disturbances:   WNL  Orientation: oriented to person, place, and time/date  Attention: Good  Concentration: Good  Memory: WNL  Fund of knowledge:  Good  Insight:   Good  Judgment:  Good  Impulse Control: Good   Risk Assessment: Danger to Self:  No Self-injurious Behavior: No Danger to Others: No Duty to Warn:no Physical Aggression / Violence:No  Access to Firearms a concern: No  Gang Involvement:No   Subjective: Pt has navigated the early arrival of her Son Noah's bus this morning by trouble-shooting & being flexible. Son is independent & responsible. Pt's work of 17 yrs is also flexible w/her concerns this week.   Interventions: Solution-Oriented/Positive Psychology  Diagnosis:Adjustment disorder with mixed anxiety and depressed mood  Plan: ST: Pt will try to record in a Notebook btwn sessions when anx tends to elevate & we will look for patterns to reduce these intense times of anx/frustration. Do this until next session. We will review your discoveries in 2 wks about Sept 11, 2023.  LT: Further reduction in anxiety & frustration overall to promote Pt health & wellbeing as a Single Parent who works a lot to make ends meet @ job that is demanding. Review progress in 2-3 mos in mid-November.  Deneise Lever, LMFT

## 2021-09-09 NOTE — Progress Notes (Signed)
                Roosevelt Eimers L Evertt Chouinard, LMFT 

## 2021-09-10 ENCOUNTER — Ambulatory Visit (HOSPITAL_BASED_OUTPATIENT_CLINIC_OR_DEPARTMENT_OTHER): Payer: Commercial Managed Care - PPO | Admitting: Family Medicine

## 2021-09-10 ENCOUNTER — Encounter (HOSPITAL_BASED_OUTPATIENT_CLINIC_OR_DEPARTMENT_OTHER): Payer: Self-pay

## 2021-09-23 ENCOUNTER — Ambulatory Visit (INDEPENDENT_AMBULATORY_CARE_PROVIDER_SITE_OTHER): Payer: Commercial Managed Care - PPO | Admitting: Behavioral Health

## 2021-09-23 DIAGNOSIS — F4323 Adjustment disorder with mixed anxiety and depressed mood: Secondary | ICD-10-CM | POA: Diagnosis not present

## 2021-09-23 NOTE — Progress Notes (Signed)
                Anita [name] L Thelia Tanksley, LMFT 

## 2021-09-23 NOTE — Progress Notes (Addendum)
[facility] Counselor/Therapist Progress Note  Patient ID: CHEYEANNE ROADCAP, MRN: [id number],    Date: 09/23/2021  Time Spent: 30 min Caregility video; Pt is home in private & Provider is @ [facility]   Treatment Type: Individual Therapy  Reported Symptoms: Pt is tearful & anxious today initially. She is worried for her Son & his transition back into school. She does want him to have a routine & not be bullied this year. He has been caught @ [employer] w/a box cutter. Pt has let him know he does not have to do things to fit in-he has to be his own person & listen to her for him to remain safe.  Pt took a day for herself & has been trying to rest.  Mental Status Exam: Appearance:  Neat     Behavior: Appropriate  Motor: Normal  Speech/Language:  Normal Rate  Affect: Appropriate and Tearful  Mood: anxious  Thought process: normal  Thought content:   WNL  Sensory/Perceptual disturbances:   WNL  Orientation: oriented to person, place, and time/date  Attention: Good  Concentration: Good  Memory: WNL  Fund of knowledge:  Good  Insight:   Good  Judgment:  Good  Impulse Control: Good   Risk Assessment: Danger to Self:  No Self-injurious Behavior: No Danger to Others: No Duty to Warn:no Physical Aggression / Violence:No  Access to Firearms a concern: No  Gang Involvement:No   Subjective: Pt is easily tearful today & receptive to positive feedback re: her 13yo Son Industrial/product designer. She is needing tips, suggestions, & positive parenting ideas to care for him.   Interventions: Solution-Oriented/Positive Psychology  Diagnosis:Adjustment disorder with mixed anxiety and depressed mood  Plan: ST: Charie expresses elevated anxiety due to Son's RTSchool. Promote tips & suggestions for helping Son adjust to his current situation in School. Pt will be extra curious about her Son's day & ask him what he learns, who he meets, & how he is managing. She will try being playful towards him as  she is able to make things lighter. We will ck on these plans next session.  Target Date: 10/14/2021 Progress: 0 Frequency: Twice monthly Modality: Rudean Hitt  LT: Akeiba is stressed & took a mental health day from work today. She c/o little time for herself. Pt will cont to incorporate self-care practices. Pt has explained to her Son that she is responsible for him attending Sch. He cannot choose to stay home just bc she is gone to work. Pt will implement discipline ideas & ways to encourage Son as discussed. Pt will incorporate positive parenting tips as we move forward & create her own Parenting style.  Target Date: 10/14/2021  Progress: 2  Frequency: Twice monthly  Modality: Claretta Fraise, LMFT

## 2021-10-05 ENCOUNTER — Emergency Department (HOSPITAL_BASED_OUTPATIENT_CLINIC_OR_DEPARTMENT_OTHER)
Admission: EM | Admit: 2021-10-05 | Discharge: 2021-10-05 | Disposition: A | Discharge status: Home / Self Care | Payer: Commercial Managed Care - PPO | Attending: Emergency Medicine | Admitting: Emergency Medicine

## 2021-10-05 ENCOUNTER — Emergency Department (HOSPITAL_BASED_OUTPATIENT_CLINIC_OR_DEPARTMENT_OTHER): Payer: Commercial Managed Care - PPO

## 2021-10-05 ENCOUNTER — Other Ambulatory Visit: Payer: Self-pay

## 2021-10-05 DIAGNOSIS — W450XXA Nail entering through skin, initial encounter: Secondary | ICD-10-CM | POA: Diagnosis not present

## 2021-10-05 DIAGNOSIS — S90932A Unspecified superficial injury of left great toe, initial encounter: Secondary | ICD-10-CM | POA: Insufficient documentation

## 2021-10-05 DIAGNOSIS — Z79899 Other long term (current) drug therapy: Secondary | ICD-10-CM | POA: Insufficient documentation

## 2021-10-05 DIAGNOSIS — S99929A Unspecified injury of unspecified foot, initial encounter: Secondary | ICD-10-CM

## 2021-10-05 MED ORDER — LIDOCAINE HCL (PF) 1 % IJ SOLN
5.0000 mL | Freq: Once | INTRAMUSCULAR | Status: DC
  Filled 2021-10-05: qty 5

## 2021-10-05 MED ORDER — CEPHALEXIN 500 MG PO CAPS: 500.0000 mg | ORAL_CAPSULE | Freq: Two times a day (BID) | ORAL | 0 refills | Status: AC

## 2021-10-05 NOTE — Discharge Instructions (Signed)
Wear postop shoe until follow-up with orthopedics.  I was able to reapproximate your nailbed.  Take antibiotic as prescribed.  Please return if you see any signs of worsening infection in your foot.  I do not think there is a fracture in your toe today and overall you had an avulsion of your nail.  You may end up losing your toenail or have deformed toenails but hopefully this will heal well.

## 2021-10-05 NOTE — ED Provider Notes (Signed)
[facility] EMERGENCY DEPT Provider Note   CSN: [id number] Arrival date & time: 10/05/21  1015     History  Chief Complaint  Patient presents with   Toe Injury    Anita [name] is a 36 y.o. female.  Patient stubbed her left big toe on bed just prior to arrival.  Left big toe lifted up off nail bed.  Tetanus shot is up-to-date.  Denies any numbness, weakness.  No other injury.  Nothing makes it worse or better.  The history is provided by the patient.       Home Medications Prior to Admission medications   Medication Sig Start Date End Date Taking? Authorizing Provider  cephALEXin (KEFLEX) 500 MG capsule Take 1 capsule (500 mg total) by mouth 2 (two) times daily for 5 days. 10/05/21 10/10/21 Yes Decie Verne, DO  hydrochlorothiazide (HYDRODIURIL) 12.5 MG tablet TAKE 1 TABLET BY MOUTH EVERY DAY IN THE MORNING 06/14/21   [place], Buren Kos, MD  labetalol (NORMODYNE) 200 MG tablet TAKE 1 TABLET BY MOUTH TWICE A DAY 06/14/21   [place], Buren Kos, MD      Allergies    Patient has no known allergies.    Review of Systems   Review of Systems  Physical Exam Updated Vital Signs BP 133/89 (BP Location: Right Arm)   Pulse 64   Temp 98.7 F (37.1 C) (Oral)   Resp 16   Ht 5\' 7"  (1.702 m)   Wt 112.5 kg   LMP 08/27/2021   SpO2 97%   BMI 38.84 kg/m  Physical Exam HENT:     Head: Normocephalic.  Cardiovascular:     Pulses: Normal pulses.  Pulmonary:     Effort: Pulmonary effort is normal.  Musculoskeletal:        General: No tenderness. Normal range of motion.  Skin:    General: Skin is warm.     Comments: Left big toe nail lifted up from nail bed fairly completely but appears to be still attached at the base on the left side of the big nail at growth plate with the right side being slightly elevated out of nailbed growth plate area, hemostatic  Neurological:     General: No focal deficit present.     Mental Status: She is alert.     Sensory: No sensory  deficit.     Motor: No weakness.     ED Results / Procedures / Treatments   Labs (all labs ordered are listed, but only abnormal results are displayed) Labs Reviewed - No data to display  EKG None  Radiology DG Foot Complete Left  Result Date: 10/05/2021 CLINICAL DATA:  Left great toe pain, injury EXAM: LEFT FOOT - COMPLETE 3+ VIEW COMPARISON:  None Available. FINDINGS: Fragmented osteophyte along the lateral base of the great toe proximal phalanx, favored nonacute. Elsewhere, there is no evidence of fracture. No dislocation. Joint spaces are relatively well preserved. Soft tissues within normal limits. IMPRESSION: Fragmented osteophyte at the base of the great toe proximal phalanx, favored nonacute. Correlate with point tenderness to exclude acute fracture. Electronically Signed   By: 10/07/2021 D.O.   On: 10/05/2021 11:10    Procedures .Nail Removal  Date/Time: 10/05/2021 11:54 AM  Performed by: 10/07/2021, DO Authorized by: Virgina Norfolk, DO   Consent:    Consent obtained:  Verbal   Consent given by:  Patient   Risks, benefits, and alternatives were discussed: yes     Risks discussed:  Bleeding,  incomplete removal, infection, pain and permanent nail deformity   Alternatives discussed:  No treatment Universal protocol:    Procedure explained and questions answered to patient or proxy's satisfaction: yes     Imaging studies available: yes     Patient identity confirmed:  Verbally with patient Location:    Foot:  L big toe Pre-procedure details:    Skin preparation:  Chlorhexidine and povidone-iodine   Preparation: Patient was prepped and draped in the usual sterile fashion   Anesthesia:    Anesthesia method:  Nerve block   Block needle gauge:  24 G   Block anesthetic:  Lidocaine 1% w/o epi   Block injection procedure:  Anatomic landmarks identified, anatomic landmarks palpated, negative aspiration for blood, introduced needle and incremental injection   Block  outcome:  Anesthesia achieved Nail Removal:    Nail bed repaired: yes     Nail bed repair material:  Tissue adhesive   Removed nail replaced and anchored: yes   Trephination:    Subungual hematoma drained: no   Post-procedure details:    Dressing:  Post-op shoe   Procedure completion:  Tolerated     Medications Ordered in ED Medications  lidocaine (PF) (XYLOCAINE) 1 % injection 5 mL (has no administration in time range)    ED Course/ Medical Decision Making/ A&P                           Medical Decision Making Amount and/or Complexity of Data Reviewed Radiology: ordered.  Risk Prescription drug management.   Lamar Sprinkles is here with left big toe injury.  Stubbed her left big toe prior to arrival.  Nailbed is raised up off nail.  Differential diagnosis is tuft fracture versus toe sprain versus suspected nail injury.  The base of the nail appears to be still attached to growth plate with may be the right portion slightly avulsed out of the nailbed.  Suspect I will be able to splint the nailbed back down with Dermabond and a brace in a postop shoe.  We will see if there is an underlying fracture with x-ray.  Tetanus is up-to-date.  X-ray with radiology report shows fragmented osteophyte at the base of the great toe proximal phalanx.  Favored to be nonacute which I suspect is not acute.  She is not very tender in the toe.  There is no tuft fracture.  Overall I was able to splint the nail back within the nailbed and Dermabond the top of the nailbed.  Placed in a postop shoe we will have her follow-up with orthopedic.  Will prescribe antibiotic for prophylaxis from infection.  She understands wound care instructions and understands return precautions.  Discharged in good condition.  This chart was dictated using voice recognition software.  Despite best efforts to proofread,  errors can occur which can change the documentation meaning.         Final Clinical Impression(s) / ED  Diagnoses Final diagnoses:  Injury of great toenail    Rx / DC Orders ED Discharge Orders          Ordered    cephALEXin (KEFLEX) 500 MG capsule  2 times daily        10/05/21 [place], [place], DO 10/05/21 1156

## 2021-10-14 ENCOUNTER — Ambulatory Visit: Payer: Commercial Managed Care - PPO | Admitting: Behavioral Health

## 2021-10-14 NOTE — Progress Notes (Unsigned)
                Belanna Manring L Kaitlinn Iversen, LMFT 

## 2021-12-03 ENCOUNTER — Other Ambulatory Visit (HOSPITAL_BASED_OUTPATIENT_CLINIC_OR_DEPARTMENT_OTHER): Payer: Self-pay | Admitting: Family Medicine

## 2021-12-03 DIAGNOSIS — I10 Essential (primary) hypertension: Secondary | ICD-10-CM

## 2022-03-24 ENCOUNTER — Telehealth (HOSPITAL_BASED_OUTPATIENT_CLINIC_OR_DEPARTMENT_OTHER): Payer: Self-pay | Admitting: Family Medicine

## 2022-03-24 NOTE — Telephone Encounter (Signed)
Pt had Flu and Covid performed at [facility] on [address], please pull the [employer]

## 2022-04-08 NOTE — Telephone Encounter (Signed)
Vaccines updated historically

## 2022-04-08 NOTE — Telephone Encounter (Signed)
Please update

## 2022-05-31 ENCOUNTER — Other Ambulatory Visit (HOSPITAL_BASED_OUTPATIENT_CLINIC_OR_DEPARTMENT_OTHER): Payer: Self-pay | Admitting: Family Medicine

## 2022-05-31 DIAGNOSIS — I10 Essential (primary) hypertension: Secondary | ICD-10-CM

## 2022-09-04 LAB — HM PAP SMEAR
HM Pap smear: NEGATIVE
HPV, high-risk: NEGATIVE

## 2022-11-22 ENCOUNTER — Other Ambulatory Visit (HOSPITAL_BASED_OUTPATIENT_CLINIC_OR_DEPARTMENT_OTHER): Payer: Self-pay | Admitting: Family Medicine

## 2022-11-22 DIAGNOSIS — I10 Essential (primary) hypertension: Secondary | ICD-10-CM

## 2022-12-17 ENCOUNTER — Encounter (HOSPITAL_BASED_OUTPATIENT_CLINIC_OR_DEPARTMENT_OTHER): Payer: Self-pay | Admitting: Family Medicine

## 2022-12-17 ENCOUNTER — Ambulatory Visit (INDEPENDENT_AMBULATORY_CARE_PROVIDER_SITE_OTHER): Payer: Commercial Managed Care - PPO | Admitting: Family Medicine

## 2022-12-17 ENCOUNTER — Encounter (HOSPITAL_BASED_OUTPATIENT_CLINIC_OR_DEPARTMENT_OTHER): Payer: Self-pay

## 2022-12-17 VITALS — BP 137/82 | HR 56 | Temp 98.6°F | Ht 66.5 in | Wt 236.1 lb

## 2022-12-17 DIAGNOSIS — I10 Essential (primary) hypertension: Secondary | ICD-10-CM | POA: Diagnosis not present

## 2022-12-17 DIAGNOSIS — Z0000 Encounter for general adult medical examination without abnormal findings: Secondary | ICD-10-CM

## 2022-12-17 MED ORDER — HYDROCHLOROTHIAZIDE 12.5 MG PO TABS: 12.5000 mg | ORAL_TABLET | Freq: Every day | ORAL | 1 refills | Status: DC

## 2022-12-17 MED ORDER — LABETALOL HCL 200 MG PO TABS: 200.0000 mg | ORAL_TABLET | Freq: Two times a day (BID) | ORAL | 1 refills | Status: DC

## 2022-12-17 NOTE — Assessment & Plan Note (Addendum)
Routine HCM labs ordered. HCM reviewed/discussed. Anticipatory guidance regarding healthy weight, lifestyle and choices given. Recommend healthy diet.  Recommend approximately 150 minutes/week of moderate intensity exercise Recommend regular dental and vision exams Always use seatbelt/lap and shoulder restraints Recommend using smoke alarms and checking batteries at least twice a year Recommend using sunscreen when outside Discussed tetanus immunization recommendations, patient is UTD [facility] - reports being UTD with Pap

## 2022-12-17 NOTE — Progress Notes (Signed)
Subjective:    CC: Annual Physical Exam  HPI:  Anita [name] is a 37 y.o. presenting for annual physical  I reviewed the past medical history, family history, social history, surgical history, and allergies today and no changes were needed.  Please see the problem list section below in epic for further details.  Past Medical History: Past Medical History:  Diagnosis Date   Family history of clotting disorder    father   Glaucoma    Glaucoma 2012   Dr. Velna Ochs, [facility]   Hemorrhoids    Hypertension    Insomnia 04/2014   associated with loss of significant other   Nexplanon in place 2015   Dr. Lynden Ang, [facility]; irregular periods prior to Nexplanon   Obesity    PCOS (polycystic ovarian syndrome)    PCOS (polycystic ovarian syndrome) 2009   Urinary tract infection    Wears glasses    Past Surgical History: Past Surgical History:  Procedure Laterality Date   COLONOSCOPY  2009   normal, due to irregular stool, blood in stool.  [facility]   WISDOM TOOTH EXTRACTION Bilateral    Social History: Social History   Socioeconomic History   Marital status: Single    Spouse name: Not on file   Number of children: Not on file   Years of education: Not on file   Highest education level: 12th grade  Occupational History   Not on file  Tobacco Use   Smoking status: Never    Passive exposure: Never   Smokeless tobacco: Never  Vaping Use   Vaping status: Never Used  Substance and Sexual Activity   Alcohol use: Yes    Alcohol/week: 0.0 standard drinks of alcohol    Comment: occasional   Drug use: No   Sexual activity: Yes  Other Topics Concern   Not on file  Social History Narrative   ** Merged History Encounter **       ** Merged History Encounter **       Lives with son, [name], exercise - not much, 08/2016.  Plans to start walking.  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   Social Determinants of Health   Financial Resource Strain: Low Risk  (12/13/2022)    Overall Financial Resource Strain (CARDIA)    Difficulty of Paying Living Expenses: Not hard at all  Food Insecurity: No Food Insecurity (12/13/2022)   Hunger Vital Sign    Worried About Running Out of Food in the Last Year: Never true    Ran Out of Food in the Last Year: Never true  Transportation Needs: No Transportation Needs (12/13/2022)   PRAPARE - Administrator, Civil Service (Medical): No    Lack of Transportation (Non-Medical): No  Physical Activity: Insufficiently Active (12/13/2022)   Exercise Vital Sign    Days of Exercise per Week: 1 day    Minutes of Exercise per Session: 60 min  Stress: No Stress Concern Present (12/13/2022)   [employer] of Occupational Health - Occupational Stress Questionnaire    Feeling of Stress : Not at all  Social Connections: Moderately Integrated (12/13/2022)   Social Connection and Isolation Panel [NHANES]    Frequency of Communication with Friends and Family: More than three times a week    Frequency of Social Gatherings with Friends and Family: Once a week    Attends Religious Services: More than 4 times per year    Active Member of Clubs or Organizations: Yes    Attends  Engineer, structural: More than 4 times per year    Marital Status: Never married   Family History: Family History  Problem Relation Age of Onset   Diabetes Mother    Hypertension Mother    Hyperlipidemia Mother    Hyperlipidemia Father    Heart disease Father 40       mural thrombis   Deep vein thrombosis Father    Dementia Maternal Grandmother    Hypertension Maternal Grandmother    Stroke Maternal Grandmother    COPD Maternal Grandfather        emphysema   Hypertension Paternal Grandmother    Cancer Maternal Aunt 74       colon   Cancer Maternal Uncle 60       pancreatic   Allergies: No Known Allergies Medications: See med rec.  Review of Systems: No headache, visual changes, nausea, vomiting, diarrhea, constipation,  dizziness, abdominal pain, skin rash, fevers, chills, night sweats, swollen lymph nodes, weight loss, chest pain, body aches, joint swelling, muscle aches, shortness of breath, mood changes, visual or auditory hallucinations.  Objective:    BP 137/82 (BP Location: Left Arm, Patient Position: Sitting, Cuff Size: Large)   Pulse (!) 56   Temp 98.6 F (37 C) (Oral)   Ht 5' 6.5" (1.689 m)   Wt 236 lb 1.6 oz (107.1 kg)   SpO2 98%   BMI 37.54 kg/m   General: Well Developed, well nourished, and in no acute distress.  Neuro: Alert and oriented x3, extra-ocular muscles intact, sensation grossly intact. Cranial nerves II through XII are intact, motor, sensory, and coordinative functions are all intact. HEENT: Normocephalic, atraumatic, pupils equal round reactive to light, neck supple, no masses, no lymphadenopathy, thyroid nonpalpable. Oropharynx, nasopharynx, external ear canals are unremarkable. Skin: Warm and dry, no rashes noted.  Cardiac: Regular rate and rhythm, no murmurs rubs or gallops.  Respiratory: Clear to auscultation bilaterally. Not using accessory muscles, speaking in full sentences.  Abdominal: Soft, nontender, nondistended, positive bowel sounds, no masses, no organomegaly. Musculoskeletal: Shoulder, elbow, wrist, hip, knee, ankle stable, and with full range of motion.  Impression and Recommendations:    Wellness examination Assessment & Plan: Routine HCM labs ordered. HCM reviewed/discussed. Anticipatory guidance regarding healthy weight, lifestyle and choices given. Recommend healthy diet.  Recommend approximately 150 minutes/week of moderate intensity exercise Recommend regular dental and vision exams Always use seatbelt/lap and shoulder restraints Recommend using smoke alarms and checking batteries at least twice a year Recommend using sunscreen when outside Discussed tetanus immunization recommendations, patient is UTD [facility] - reports being UTD with  Pap  Orders: -     CBC with Differential/Platelet; Future -     Comprehensive metabolic panel; Future -     Hemoglobin A1c; Future -     Lipid panel; Future -     TSH Rfx on Abnormal to Free T4; Future  Primary hypertension -     Labetalol HCl; Take 1 tablet (200 mg total) by mouth 2 (two) times daily.  Dispense: 180 tablet; Refill: 1  Other orders -     hydroCHLOROthiazide; Take 1 tablet (12.5 mg total) by mouth daily.  Dispense: 90 tablet; Refill: 1  Return in about 1 year (around 12/17/2023) for CPE.   ___________________________________________ Gladies Sofranko de [place], MD, ABFM, CAQSM Primary Care and Sports Medicine [facility]

## 2022-12-18 ENCOUNTER — Encounter (HOSPITAL_BASED_OUTPATIENT_CLINIC_OR_DEPARTMENT_OTHER): Payer: Self-pay | Admitting: *Deleted

## 2022-12-19 ENCOUNTER — Other Ambulatory Visit (HOSPITAL_BASED_OUTPATIENT_CLINIC_OR_DEPARTMENT_OTHER): Payer: Commercial Managed Care - PPO

## 2022-12-19 ENCOUNTER — Other Ambulatory Visit (HOSPITAL_BASED_OUTPATIENT_CLINIC_OR_DEPARTMENT_OTHER): Payer: Self-pay | Admitting: Family Medicine

## 2022-12-19 DIAGNOSIS — Z0000 Encounter for general adult medical examination without abnormal findings: Secondary | ICD-10-CM

## 2022-12-20 LAB — CBC WITH DIFFERENTIAL/PLATELET
Basophils Absolute: 0 10*3/uL (ref 0.0–0.2)
Basos: 0 %
EOS (ABSOLUTE): 0.1 10*3/uL (ref 0.0–0.4)
Eos: 1 %
Hematocrit: 39.2 % (ref 34.0–46.6)
Hemoglobin: 11.8 g/dL (ref 11.1–15.9)
Immature Grans (Abs): 0 10*3/uL (ref 0.0–0.1)
Immature Granulocytes: 0 %
Lymphocytes Absolute: 0.9 10*3/uL (ref 0.7–3.1)
Lymphs: 17 %
MCH: 22.6 pg — ABNORMAL LOW (ref 26.6–33.0)
MCHC: 30.1 g/dL — ABNORMAL LOW (ref 31.5–35.7)
MCV: 75 fL — ABNORMAL LOW (ref 79–97)
Monocytes Absolute: 0.5 10*3/uL (ref 0.1–0.9)
Monocytes: 9 %
Neutrophils Absolute: 3.8 10*3/uL (ref 1.4–7.0)
Neutrophils: 73 %
Platelets: 364 10*3/uL (ref 150–450)
RBC: 5.22 x10E6/uL (ref 3.77–5.28)
RDW: 15.1 % (ref 11.7–15.4)
WBC: 5.3 10*3/uL (ref 3.4–10.8)

## 2022-12-20 LAB — COMPREHENSIVE METABOLIC PANEL
ALT: 11 [IU]/L (ref 0–32)
AST: 20 [IU]/L (ref 0–40)
Albumin: 3.9 g/dL (ref 3.9–4.9)
Alkaline Phosphatase: 81 [IU]/L (ref 44–121)
BUN/Creatinine Ratio: 13 (ref 9–23)
BUN: 11 mg/dL (ref 6–20)
Bilirubin Total: 0.3 mg/dL (ref 0.0–1.2)
CO2: 25 mmol/L (ref 20–29)
Calcium: 9.1 mg/dL (ref 8.7–10.2)
Chloride: 102 mmol/L (ref 96–106)
Creatinine, Ser: 0.82 mg/dL (ref 0.57–1.00)
Globulin, Total: 3 g/dL (ref 1.5–4.5)
Glucose: 79 mg/dL (ref 70–99)
Potassium: 4 mmol/L (ref 3.5–5.2)
Sodium: 141 mmol/L (ref 134–144)
Total Protein: 6.9 g/dL (ref 6.0–8.5)
eGFR: 93 mL/min/{1.73_m2} (ref >59)

## 2022-12-20 LAB — LIPID PANEL
Chol/HDL Ratio: 2.9 {ratio} (ref 0.0–4.4)
Cholesterol, Total: 181 mg/dL (ref 100–199)
HDL: 63 mg/dL (ref >39)
LDL Chol Calc (NIH): 109 mg/dL — ABNORMAL HIGH (ref 0–99)
Triglycerides: 42 mg/dL (ref 0–149)
VLDL Cholesterol Cal: 9 mg/dL (ref 5–40)

## 2022-12-20 LAB — HEMOGLOBIN A1C
Est. average glucose Bld gHb Est-mCnc: 114 mg/dL
Hgb A1c MFr Bld: 5.6 % (ref 4.8–5.6)

## 2022-12-20 LAB — TSH RFX ON ABNORMAL TO FREE T4: TSH: 0.917 u[IU]/mL (ref 0.450–4.500)

## 2022-12-28 ENCOUNTER — Ambulatory Visit
Admission: EM | Admit: 2022-12-28 | Discharge: 2022-12-28 | Disposition: A | Discharge status: Home / Self Care | Payer: Commercial Managed Care - PPO | Attending: Internal Medicine | Admitting: Internal Medicine

## 2022-12-28 DIAGNOSIS — J209 Acute bronchitis, unspecified: Secondary | ICD-10-CM | POA: Diagnosis not present

## 2022-12-28 MED ORDER — BENZONATATE 200 MG PO CAPS: 200.0000 mg | ORAL_CAPSULE | Freq: Three times a day (TID) | ORAL | 0 refills | Status: AC | PRN

## 2022-12-28 MED ORDER — PROMETHAZINE-DM 6.25-15 MG/5ML PO SYRP: 5.0000 mL | ORAL_SOLUTION | Freq: Every evening | ORAL | 0 refills | Status: AC | PRN

## 2022-12-28 MED ORDER — AZITHROMYCIN 250 MG PO TABS: 250.0000 mg | ORAL_TABLET | Freq: Every day | ORAL | 0 refills | Status: AC

## 2022-12-28 NOTE — ED Provider Notes (Signed)
[facility]    CSN: [id number] Arrival date & time: 12/28/22  1310      History   Chief Complaint Chief Complaint  Patient presents with   Cough    Cough, chest congestion, chest pain, and nasal congestion x5 days    HPI Anita [name] is a 37 y.o. female  presents for evaluation of URI symptoms for 5 days. Patient reports associated symptoms of cough, congestion, chest wall pain with coughing. Denies N/V/D, fevers, sore throat, ear pain, body aches, shortness of breath. Patient does not have a hx of asthma. Patient does not have a history of smoking.  Reports no sick contacts.  Pt has taken cough medicine OTC for symptoms. Pt has no other concerns at this time.    Cough   Past Medical History:  Diagnosis Date   Family history of clotting disorder    father   Glaucoma    Glaucoma 2012   Dr. Velna Ochs, [facility]   Hemorrhoids    Hypertension    Insomnia 04/2014   associated with loss of significant other   Nexplanon in place 2015   Dr. Lynden Ang, [facility]; irregular periods prior to Nexplanon   Obesity    PCOS (polycystic ovarian syndrome)    PCOS (polycystic ovarian syndrome) 2009   Urinary tract infection    Wears glasses     Patient Active Problem List   Diagnosis Date Noted   Phase of life problem 07/30/2021   Primary hypertension 12/20/2020   Wellness examination 08/25/2016   PCOS (polycystic ovarian syndrome) 08/25/2016   Constipation 08/25/2016   Blood in stool 08/25/2016   Class 2 obesity with serious comorbidity and body mass index (BMI) of 38.0 to 38.9 in adult 08/25/2016   Glaucoma 08/28/2011    Past Surgical History:  Procedure Laterality Date   COLONOSCOPY  2009   normal, due to irregular stool, blood in stool.  [facility]   WISDOM TOOTH EXTRACTION Bilateral     OB History     Gravida  1   Para  1   Term  0   Preterm  1   AB  0   Living         SAB  0   IAB  0   Ectopic  0   Multiple       Live Births               Home Medications    Prior to Admission medications   Medication Sig Start Date End Date Taking? Authorizing Provider  azithromycin (ZITHROMAX) 250 MG tablet Take 1 tablet (250 mg total) by mouth daily. Take first 2 tablets together, then 1 every day until finished. 01/01/23  Yes Radford Pax, NP  benzonatate (TESSALON) 200 MG capsule Take 1 capsule (200 mg total) by mouth 3 (three) times daily as needed. 12/28/22  Yes Radford Pax, NP  hydrochlorothiazide (HYDRODIURIL) 12.5 MG tablet Take 1 tablet (12.5 mg total) by mouth daily. 12/17/22  Yes de [place], Raymond J, MD  labetalol (NORMODYNE) 200 MG tablet Take 1 tablet (200 mg total) by mouth 2 (two) times daily. 12/17/22  Yes de [place], Raymond J, MD  latanoprost (XALATAN) 0.005 % ophthalmic solution Place 1 drop into both eyes at bedtime.   Yes [provider]  promethazine-dextromethorphan (PROMETHAZINE-DM) 6.25-15 MG/5ML syrup Take 5 mLs by mouth at bedtime as needed for cough. 12/28/22  Yes Radford Pax, NP  Timolol Maleate PF  0.5 % SOLN Both eyes twice a day   Yes [provider]  tranexamic acid (LYSTEDA) 650 MG TABS tablet Take 1,300 mg by mouth 3 (three) times daily. 09/04/22  Yes [provider]    Family History Family History  Problem Relation Age of Onset   Diabetes Mother    Hypertension Mother    Hyperlipidemia Mother    Hyperlipidemia Father    Heart disease Father 40       mural thrombis   Deep vein thrombosis Father    Dementia Maternal Grandmother    Hypertension Maternal Grandmother    Stroke Maternal Grandmother    COPD Maternal Grandfather        emphysema   Hypertension Paternal Grandmother    Cancer Maternal Aunt 38       colon   Cancer Maternal Uncle 69       pancreatic    Social History Social History   Tobacco Use   Smoking status: Never    Passive exposure: Never   Smokeless tobacco: Never  Vaping Use   Vaping status: Never Used  Substance  Use Topics   Alcohol use: Not Currently    Comment: occasional   Drug use: No     Allergies   Patient has no known allergies.   Review of Systems Review of Systems  HENT:  Positive for congestion.   Respiratory:  Positive for cough.        Chest wall pain with coughing only     Physical Exam Triage Vital Signs ED Triage Vitals  Encounter Vitals Group     BP 12/28/22 1510 (!) 136/90     Systolic BP Percentile --      Diastolic BP Percentile --      Pulse Rate 12/28/22 1510 (!) 55     Resp --      Temp 12/28/22 1510 98.6 F (37 C)     Temp src --      SpO2 12/28/22 1510 98 %     Weight 12/28/22 1508 232 lb (105.2 kg)     Height 12/28/22 1508 5' 6.5" (1.689 m)     Head Circumference --      Peak Flow --      Pain Score 12/28/22 1508 3     Pain Loc --      Pain Education --      Exclude from Growth Chart --    No data found.  Updated Vital Signs BP (!) 136/90   Pulse (!) 55   Temp 98.6 F (37 C)   Ht 5' 6.5" (1.689 m)   Wt 232 lb (105.2 kg)   LMP 11/28/2022   SpO2 98%   BMI 36.88 kg/m   Visual Acuity Right Eye Distance:   Left Eye Distance:   Bilateral Distance:    Right Eye Near:   Left Eye Near:    Bilateral Near:     Physical Exam Vitals and nursing note reviewed.  Constitutional:      General: She is not in acute distress.    Appearance: She is well-developed. She is not ill-appearing.  HENT:     Head: Normocephalic and atraumatic.     Right Ear: Tympanic membrane and ear canal normal.     Left Ear: Tympanic membrane and ear canal normal.     Nose: Congestion present.     Mouth/Throat:     Mouth: Mucous membranes are moist.     Pharynx: Oropharynx is clear.  Uvula midline. No oropharyngeal exudate or posterior oropharyngeal erythema.     Tonsils: No tonsillar exudate or tonsillar abscesses.  Eyes:     Conjunctiva/sclera: Conjunctivae normal.     Pupils: Pupils are equal, round, and reactive to light.  Cardiovascular:     Rate and  Rhythm: Normal rate and regular rhythm.     Heart sounds: Normal heart sounds.  Pulmonary:     Effort: Pulmonary effort is normal. No respiratory distress.     Breath sounds: Normal breath sounds. No stridor. No wheezing, rhonchi or rales.  Musculoskeletal:     Cervical back: Normal range of motion and neck supple.  Lymphadenopathy:     Cervical: No cervical adenopathy.  Skin:    General: Skin is warm and dry.  Neurological:     General: No focal deficit present.     Mental Status: She is alert and oriented to person, place, and time.  Psychiatric:        Mood and Affect: Mood normal.        Behavior: Behavior normal.      UC Treatments / Results  Labs (all labs ordered are listed, but only abnormal results are displayed) Labs Reviewed - No data to display  EKG   Radiology No results found.  Procedures Procedures (including critical care time)  Medications Ordered in UC Medications - No data to display  Initial Impression / Assessment and Plan / UC Course  I have reviewed the triage vital signs and the nursing notes.  Pertinent labs & imaging results that were available during my care of the patient were reviewed by me and considered in my medical decision making (see chart for details).     Reviewed exam and symptoms with patient.  No red flags.  Vital signs stable and lungs CTA.  Will defer chest x-ray at this time.  Will start Tessalon during the day for cough and Promethazine DM at night as needed, side effect profile reviewed.  Provisional prescription for Zithromax provided with instruction not to take unless symptoms do not improve or worsen by 12/19 and she verbalized understanding.  PCP follow-up 2 days for recheck.  ER precautions reviewed. Final Clinical Impressions(s) / UC Diagnoses   Final diagnoses:  Acute bronchitis, unspecified organism     Discharge Instructions      You may take Tessalon 3 times during the day as needed for cough and  Promethazine DM as needed at night.  Please note Promethazine DM can make you drowsy.  Do not drink alcohol or drive while on this medication.  A provisional prescription for Zithromax has been provided.  Please do not take unless your symptoms do not improve or worsen by 12/19.  Lots of rest and fluids.  Please follow-up with your PCP in 2 days for recheck.  Please go to the ER for any worsening symptoms.  Hope you feel better soon!    ED Prescriptions     Medication Sig Dispense Auth. Provider   benzonatate (TESSALON) 200 MG capsule Take 1 capsule (200 mg total) by mouth 3 (three) times daily as needed. 20 capsule Radford Pax, NP   promethazine-dextromethorphan (PROMETHAZINE-DM) 6.25-15 MG/5ML syrup Take 5 mLs by mouth at bedtime as needed for cough. 118 mL Radford Pax, NP   azithromycin (ZITHROMAX) 250 MG tablet Take 1 tablet (250 mg total) by mouth daily. Take first 2 tablets together, then 1 every day until finished. 6 tablet Radford Pax, NP  PDMP not reviewed this encounter.   Radford Pax, NP 12/28/22 [id number]

## 2022-12-28 NOTE — Discharge Instructions (Addendum)
You may take Tessalon 3 times during the day as needed for cough and Promethazine DM as needed at night.  Please note Promethazine DM can make you drowsy.  Do not drink alcohol or drive while on this medication.  A provisional prescription for Zithromax has been provided.  Please do not take unless your symptoms do not improve or worsen by 12/19.  Lots of rest and fluids.  Please follow-up with your PCP in 2 days for recheck.  Please go to the ER for any worsening symptoms.  Hope you feel better soon!

## 2022-12-28 NOTE — ED Triage Notes (Signed)
Pt states that she has a cough, chest congestion, chest pain and nasal congestion x5 days

## 2023-03-23 ENCOUNTER — Encounter (HOSPITAL_BASED_OUTPATIENT_CLINIC_OR_DEPARTMENT_OTHER): Payer: Self-pay

## 2023-03-23 ENCOUNTER — Emergency Department (HOSPITAL_BASED_OUTPATIENT_CLINIC_OR_DEPARTMENT_OTHER)
Admission: EM | Admit: 2023-03-23 | Discharge: 2023-03-23 | Disposition: A | Discharge status: Home / Self Care | Attending: Emergency Medicine | Admitting: Emergency Medicine

## 2023-03-23 ENCOUNTER — Other Ambulatory Visit: Payer: Self-pay

## 2023-03-23 DIAGNOSIS — I10 Essential (primary) hypertension: Secondary | ICD-10-CM | POA: Diagnosis not present

## 2023-03-23 DIAGNOSIS — M545 Low back pain, unspecified: Secondary | ICD-10-CM | POA: Insufficient documentation

## 2023-03-23 DIAGNOSIS — R350 Frequency of micturition: Secondary | ICD-10-CM | POA: Insufficient documentation

## 2023-03-23 DIAGNOSIS — Z79899 Other long term (current) drug therapy: Secondary | ICD-10-CM | POA: Diagnosis not present

## 2023-03-23 LAB — URINALYSIS, ROUTINE W REFLEX MICROSCOPIC
Bilirubin Urine: NEGATIVE
Glucose, UA: NEGATIVE mg/dL
Hgb urine dipstick: NEGATIVE
Ketones, ur: NEGATIVE mg/dL
Leukocytes,Ua: NEGATIVE
Nitrite: NEGATIVE
Protein, ur: NEGATIVE mg/dL
Specific Gravity, Urine: 1.015 (ref 1.005–1.030)
pH: 5.5 (ref 5.0–8.0)

## 2023-03-23 LAB — BASIC METABOLIC PANEL
Anion gap: 10 (ref 5–15)
BUN: 13 mg/dL (ref 6–20)
CO2: 27 mmol/L (ref 22–32)
Calcium: 9.4 mg/dL (ref 8.9–10.3)
Chloride: 99 mmol/L (ref 98–111)
Creatinine, Ser: 0.87 mg/dL (ref 0.44–1.00)
GFR, Estimated: >60 mL/min (ref >60)
Glucose, Bld: 81 mg/dL (ref 70–99)
Potassium: 3.7 mmol/L (ref 3.5–5.1)
Sodium: 136 mmol/L (ref 135–145)

## 2023-03-23 LAB — CBC
HCT: 42.2 % (ref 36.0–46.0)
Hemoglobin: 13.6 g/dL (ref 12.0–15.0)
MCH: 22.8 pg — ABNORMAL LOW (ref 26.0–34.0)
MCHC: 32.2 g/dL (ref 30.0–36.0)
MCV: 70.8 fL — ABNORMAL LOW (ref 80.0–100.0)
Platelets: 294 10*3/uL (ref 150–400)
RBC: 5.96 MIL/uL — ABNORMAL HIGH (ref 3.87–5.11)
RDW: 14.2 % (ref 11.5–15.5)
WBC: 6.9 10*3/uL (ref 4.0–10.5)
nRBC: 0 % (ref 0.0–0.2)

## 2023-03-23 LAB — PREGNANCY, URINE: Preg Test, Ur: NEGATIVE

## 2023-03-23 MED ORDER — KETOROLAC TROMETHAMINE 15 MG/ML IJ SOLN
15.0000 mg | Freq: Once | INTRAMUSCULAR | Status: AC
  Administered 2023-03-23: 15 mg via INTRAVENOUS
  Filled 2023-03-23: qty 1

## 2023-03-23 MED ORDER — PHENAZOPYRIDINE HCL 100 MG PO TABS
95.0000 mg | ORAL_TABLET | Freq: Once | ORAL | Status: AC
  Administered 2023-03-23: 100 mg via ORAL
  Filled 2023-03-23: qty 1

## 2023-03-23 MED ORDER — MELOXICAM 15 MG PO TABS: 15.0000 mg | ORAL_TABLET | Freq: Every day | ORAL | 0 refills | Status: AC | PRN

## 2023-03-23 MED ORDER — PHENAZOPYRIDINE HCL 200 MG PO TABS: 200.0000 mg | ORAL_TABLET | Freq: Three times a day (TID) | ORAL | 0 refills | Status: AC

## 2023-03-23 NOTE — ED Provider Notes (Signed)
 [place] EMERGENCY DEPARTMENT AT [facility] Provider Note   CSN: [id number] Arrival date & time: 03/23/23  1221     History  Chief Complaint  Patient presents with   Back Pain    Anita [name] is a 38 y.o. female.   Back Pain   38 year old female presents emergency department complaints of low back pain as well as urinary frequency.  States she has been having symptoms for the past week or so.  Reports pain in the middle of her low back that is worsened with movement.  Has tried no medication for her symptoms.  Denies any dysuria, abdominal pain, nausea, vomiting, hematuria.  Denies any flank pain.  States she is concerned she may have a UTI.  Past medical history significant for PCOS, hypertension, hemorrhoid  Home Medications Prior to Admission medications   Medication Sig Start Date End Date Taking? Authorizing Provider  meloxicam (MOBIC) 15 MG tablet Take 1 tablet (15 mg total) by mouth daily as needed. 03/23/23  Yes Sherian Maroon A, PA  phenazopyridine (PYRIDIUM) 200 MG tablet Take 1 tablet (200 mg total) by mouth 3 (three) times daily. 03/23/23  Yes Sherian Maroon A, PA  azithromycin (ZITHROMAX) 250 MG tablet Take 1 tablet (250 mg total) by mouth daily. Take first 2 tablets together, then 1 every day until finished. 01/01/23   Radford Pax, NP  benzonatate (TESSALON) 200 MG capsule Take 1 capsule (200 mg total) by mouth 3 (three) times daily as needed. 12/28/22   Radford Pax, NP  hydrochlorothiazide (HYDRODIURIL) 12.5 MG tablet Take 1 tablet (12.5 mg total) by mouth daily. 12/17/22   de [place], Buren Kos, MD  labetalol (NORMODYNE) 200 MG tablet Take 1 tablet (200 mg total) by mouth 2 (two) times daily. 12/17/22   de [place], Buren Kos, MD  latanoprost (XALATAN) 0.005 % ophthalmic solution Place 1 drop into both eyes at bedtime.    [provider]  promethazine-dextromethorphan (PROMETHAZINE-DM) 6.25-15 MG/5ML syrup Take 5 mLs by mouth at bedtime as needed  for cough. 12/28/22   Radford Pax, NP  Timolol Maleate PF 0.5 % SOLN Both eyes twice a day    [provider]  tranexamic acid (LYSTEDA) 650 MG TABS tablet Take 1,300 mg by mouth 3 (three) times daily. 09/04/22   [provider]      Allergies    Patient has no known allergies.    Review of Systems   Review of Systems  Musculoskeletal:  Positive for back pain.  All other systems reviewed and are negative.   Physical Exam Updated Vital Signs BP 136/62   Pulse (!) 47   Temp 97.8 F (36.6 C)   Resp 15   SpO2 100%  Physical Exam Vitals and nursing note reviewed.  Constitutional:      General: She is not in acute distress.    Appearance: She is well-developed.  HENT:     Head: Normocephalic and atraumatic.  Eyes:     Conjunctiva/sclera: Conjunctivae normal.  Cardiovascular:     Rate and Rhythm: Normal rate and regular rhythm.     Heart sounds: No murmur heard. Pulmonary:     Effort: Pulmonary effort is normal. No respiratory distress.     Breath sounds: Normal breath sounds.  Abdominal:     Palpations: Abdomen is soft.     Tenderness: There is no abdominal tenderness.  Musculoskeletal:        General: No swelling.     Cervical back:  Neck supple.     Comments: Midline tenderness lumbar spine L2-L3 region without obvious type of or deformity.  No paraspinal tenderness.  No CVA tenderness.  Muscular strength 5 out of 5 lower extremities.  No sensory deficits along major nerve distributions of the lower extremities.  DTR symmetric at patella as well as Achilles.  Pedal pulses 2+ bilaterally.  Skin:    General: Skin is warm and dry.     Capillary Refill: Capillary refill takes less than 2 seconds.  Neurological:     Mental Status: She is alert.  Psychiatric:        Mood and Affect: Mood normal.     ED Results / Procedures / Treatments   Labs (all labs ordered are listed, but only abnormal results are displayed) Labs Reviewed  CBC - Abnormal; Notable  for the following components:      Result Value   RBC 5.96 (*)    MCV 70.8 (*)    MCH 22.8 (*)    All other components within normal limits  URINALYSIS, ROUTINE W REFLEX MICROSCOPIC  BASIC METABOLIC PANEL  PREGNANCY, URINE    EKG None  Radiology No results found.  Procedures Procedures    Medications Ordered in ED Medications  ketorolac (TORADOL) 15 MG/ML injection 15 mg (15 mg Intravenous Given 03/23/23 1657)  phenazopyridine (PYRIDIUM) tablet 100 mg (100 mg Oral Given 03/23/23 1656)    ED Course/ Medical Decision Making/ A&P                                 Medical Decision Making Amount and/or Complexity of Data Reviewed Labs: ordered.  Risk Prescription drug management.   This patient presents to the ED for concern of back pain, urinary frequency, this involves an extensive number of treatment options, and is a complaint that carries with it a high risk of complications and morbidity.  The differential diagnosis includes GI, pyelonephritis, nephrolithiasis, fracture, dislocation, strain/sprain, ligamentous/tendinous injury, cauda equina, spinal epidural abscess, AAA, other   Co morbidities that complicate the patient evaluation  See HPI   Additional history obtained:  Additional history obtained from EMR External records from outside source obtained and reviewed including hospital records   Lab Tests:  I Ordered, and personally interpreted labs.  The pertinent results include: No leukocytosis.  No evidence of anemia.  Placed within range.  No Electra abnormalities.  No renal dysfunction.  UA without abnormality.  Urine pregnancy negative.   Imaging Studies ordered:  N/a   Cardiac Monitoring: / EKG:  The patient was maintained on a cardiac monitor.  I personally viewed and interpreted the cardiac monitored which showed an underlying rhythm of: Sinus rhythm   Consultations Obtained:  N/a   Problem List / ED Course / Critical interventions /  Medication management  Low back pain, urinary frequency I ordered medication including Pyridium, Toradol   Reevaluation of the patient after these medicines showed that the patient improved I have reviewed the patients home medicines and have made adjustments as needed   Social Determinants of Health:  Denies tobacco, illicit drug use.   Test / Admission - Considered:  Low back pain, urinary frequency Vitals signs within normal range and stable throughout visit. Laboratory studies significant for: See above 38 year old female presents emergency department complaints of low back pain as well as urinary frequency.  States she has been having symptoms for the past week or so.  Reports  pain in the middle of her low back that is worsened with movement.  Has tried no medication for her symptoms.  Denies any dysuria, abdominal pain, nausea, vomiting, hematuria.  Denies any flank pain.  States she is concerned she may have a UTI. On exam, slight midline tenderness lower lumbar spine.  No paraspinal tenderness or CVA tenderness.  No red flag signs for back pain on HPI/PE; low suspicion for cauda equina, spinal epidural abscess or other spinal cord compression/impingement.  No CVA tenderness; UA without signs of infection or RBCs; low suspicion for pyonephritis or nephrolithiasis.  Patient without abdominal pain rating to back, pulse deficits, neurodeficits, hypertension; low suspicion for aortic dissection.  Suspect musculoskeletal etiology as cause of patient's back pain.  Will treat with NSAIDs and have her follow-up with primary care for reassessment.  Regarding urinary frequency, no evidence of infectious etiology.  No evidence of DKA/HHS/hyperglycemia.  Unsure of exact etiology.  Will trial Pyridium and have her follow-up with urology in the outpatient setting if still symptomatic.  Treatment plan discussed with patient and she acknowledged understanding was agreeable to said plan.  Patient overall  well-appearing, afebrile in no acute distress. Worrisome signs and symptoms were discussed with the patient, and the patient acknowledged understanding to return to the ED if noticed. Patient was stable upon discharge.          Final Clinical Impression(s) / ED Diagnoses Final diagnoses:  Acute midline low back pain without sciatica  Urinary frequency    Rx / DC Orders ED Discharge Orders          Ordered    meloxicam (MOBIC) 15 MG tablet  Daily PRN        03/23/23 1714    phenazopyridine (PYRIDIUM) 200 MG tablet  3 times daily        03/23/23 1714              Peter Garter, [place] 03/23/23 1734    Derwood Kaplan, MD 03/24/23 2338

## 2023-03-23 NOTE — ED Triage Notes (Signed)
 Pt c/o lower back pain, frequent urination associated. Denies injury

## 2023-03-23 NOTE — Discharge Instructions (Signed)
 As discussed, your workup today was overall reassuring.  Urine did not look infected.  Your laboratory studies appeared normal.  Will treat your symptoms with anti-inflammatories.  Will also send him with Pyridium which should help with your urinary symptoms.  Attaches information for urology specialist to follow-up with if you are still symptomatic.  Please do not hesitate to return if the worrisome signs and symptoms we discussed become apparent.

## 2023-08-15 ENCOUNTER — Other Ambulatory Visit (HOSPITAL_BASED_OUTPATIENT_CLINIC_OR_DEPARTMENT_OTHER): Payer: Self-pay | Admitting: Family Medicine

## 2023-08-15 DIAGNOSIS — I10 Essential (primary) hypertension: Secondary | ICD-10-CM

## 2023-12-03 ENCOUNTER — Other Ambulatory Visit (HOSPITAL_BASED_OUTPATIENT_CLINIC_OR_DEPARTMENT_OTHER): Payer: Self-pay | Admitting: Family Medicine

## 2023-12-18 ENCOUNTER — Encounter (HOSPITAL_BASED_OUTPATIENT_CLINIC_OR_DEPARTMENT_OTHER): Payer: Commercial Managed Care - PPO | Admitting: Family Medicine

## 2024-02-15 ENCOUNTER — Other Ambulatory Visit (HOSPITAL_BASED_OUTPATIENT_CLINIC_OR_DEPARTMENT_OTHER): Payer: Self-pay | Admitting: Family Medicine

## 2024-02-15 DIAGNOSIS — I10 Essential (primary) hypertension: Secondary | ICD-10-CM

## 2024-02-18 ENCOUNTER — Ambulatory Visit
Admission: EM | Admit: 2024-02-18 | Discharge: 2024-02-18 | Disposition: A | Discharge status: Home / Self Care | Source: Home / Self Care

## 2024-02-18 DIAGNOSIS — S39012A Strain of muscle, fascia and tendon of lower back, initial encounter: Secondary | ICD-10-CM

## 2024-02-18 MED ORDER — CYCLOBENZAPRINE HCL 5 MG PO TABS: 5.0000 mg | ORAL_TABLET | Freq: Every evening | ORAL | 0 refills | Status: AC | PRN

## 2024-02-18 MED ORDER — IBUPROFEN 800 MG PO TABS: 800.0000 mg | ORAL_TABLET | Freq: Three times a day (TID) | ORAL | 0 refills | Status: AC | PRN

## 2024-02-18 NOTE — ED Provider Notes (Signed)
 " [facility]  Note:  This document was prepared using Conservation officer, historic buildings and may include unintentional dictation errors.  MRN: [id number] DOB: [birth year]  Subjective:   Anita [name] is a 39 y.o. female presenting for 2-day history of persistent albeit improving low back pain.  Symptoms started yesterday and were severe, limited her range of motion with rotation and flexion, extension at the lumbar region.  Patient has been doing extensive shoveling of snow and ice.  No fall, trauma, numbness or tingling, saddle paresthesia, changes to bowel or urinary habits, radicular symptoms.   Current Outpatient Medications  Medication Instructions   azithromycin  (ZITHROMAX ) 250 mg, Oral, Daily, Take first 2 tablets together, then 1 every day until finished.   benzonatate  (TESSALON ) 200 mg, Oral, 3 times daily PRN   hydrochlorothiazide  (HYDRODIURIL ) 12.5 mg, Oral, Daily   labetalol  (NORMODYNE ) 200 mg, Oral, 2 times daily   latanoprost (XALATAN) 0.005 % ophthalmic solution 1 drop, Daily at bedtime   meloxicam  (MOBIC ) 15 mg, Oral, Daily PRN   phenazopyridine  (PYRIDIUM ) 200 mg, Oral, 3 times daily   promethazine -dextromethorphan (PROMETHAZINE -DM) 6.25-15 MG/5ML syrup 5 mLs, Oral, At bedtime PRN   Timolol Maleate PF 0.5 % SOLN Both eyes twice a day   tranexamic acid (LYSTEDA) 1,300 mg, 3 times daily    Allergies[1]  Past Medical History:  Diagnosis Date   Family history of clotting disorder    father   Glaucoma    Glaucoma 2012   Dr. Lorrene Devonshire, [facility]   Hemorrhoids    Hypertension    Insomnia 04/2014   associated with loss of significant other   Nexplanon in place 2015   Dr. Almarie Hammersmith, [facility]; irregular periods prior to Nexplanon   Obesity    PCOS (polycystic ovarian syndrome)    PCOS (polycystic ovarian syndrome) 2009   Urinary tract infection    Wears glasses      Past Surgical History:  Procedure Laterality Date    COLONOSCOPY  2009   normal, due to irregular stool, blood in stool.  [facility]   WISDOM TOOTH EXTRACTION Bilateral     Family History  Problem Relation Age of Onset   Diabetes Mother    Hypertension Mother    Hyperlipidemia Mother    Hyperlipidemia Father    Heart disease Father 40       mural thrombis   Deep vein thrombosis Father    Dementia Maternal Grandmother    Hypertension Maternal Grandmother    Stroke Maternal Grandmother    COPD Maternal Grandfather        emphysema   Hypertension Paternal Grandmother    Cancer Maternal Aunt 84       colon   Cancer Maternal Uncle 27       pancreatic    Social History   Occupational History   Not on file  Tobacco Use   Smoking status: Never    Passive exposure: Never   Smokeless tobacco: Never  Vaping Use   Vaping status: Never Used  Substance and Sexual Activity   Alcohol use: Not Currently   Drug use: No   Sexual activity: Not on file     ROS   Objective:   Vitals: BP 126/84 (BP Location: Right Arm)   Pulse (!) 55   Temp 98.3 F (36.8 C) (Oral)   Resp 16   LMP 02/13/2024   SpO2 99%   Physical Exam Constitutional:      General:  She is not in acute distress.    Appearance: Normal appearance. She is well-developed. She is not ill-appearing, toxic-appearing or diaphoretic.  HENT:     Head: Normocephalic and atraumatic.     Nose: Nose normal.     Mouth/Throat:     Mouth: Mucous membranes are moist.  Eyes:     General: No scleral icterus.       Right eye: No discharge.        Left eye: No discharge.     Extraocular Movements: Extraocular movements intact.  Cardiovascular:     Rate and Rhythm: Normal rate.  Pulmonary:     Effort: Pulmonary effort is normal.  Musculoskeletal:     Lumbar back: Spasms and tenderness present. No swelling, edema, deformity, signs of trauma, lacerations or bony tenderness. Normal range of motion. Negative right straight leg raise test and negative left straight leg raise test. No  scoliosis.       Back:  Skin:    General: Skin is warm and dry.  Neurological:     General: No focal deficit present.     Mental Status: She is alert and oriented to person, place, and time.  Psychiatric:        Mood and Affect: Mood normal.        Behavior: Behavior normal.     Assessment and Plan :   PDMP not reviewed this encounter.  1. Lumbar strain, initial encounter    Will manage conservatively for back strain with NSAID and muscle relaxant, rest and modification of physical activity.  Anticipatory guidance provided.  Counseled patient on potential for adverse effects with medications prescribed/recommended today, ER and return-to-clinic precautions discussed, patient verbalized understanding.     [1] No Known Allergies    Anita [name], [place] 02/18/24 [id number]  "

## 2024-02-18 NOTE — ED Triage Notes (Signed)
 Pt c/o lower back pain started yesterday after shoveling snow for 2 days-last dose ibuprofen  last night-NAD-steady gait

## 2024-04-11 ENCOUNTER — Encounter (HOSPITAL_BASED_OUTPATIENT_CLINIC_OR_DEPARTMENT_OTHER): Admitting: Family Medicine
